# Patient Record
Sex: Female | Born: 1970 | Race: Asian | Hispanic: No | Marital: Married | Smoking: Never smoker
Health system: Southern US, Community
[De-identification: ages and names within clinical notes are randomized; demographics above are authoritative.]

## PROBLEM LIST (undated history)

## (undated) DIAGNOSIS — R0789 Other chest pain: Secondary | ICD-10-CM

## (undated) DIAGNOSIS — D649 Anemia, unspecified: Secondary | ICD-10-CM

## (undated) DIAGNOSIS — R7303 Prediabetes: Secondary | ICD-10-CM

## (undated) DIAGNOSIS — R011 Cardiac murmur, unspecified: Secondary | ICD-10-CM

## (undated) DIAGNOSIS — K219 Gastro-esophageal reflux disease without esophagitis: Secondary | ICD-10-CM

## (undated) HISTORY — PX: WISDOM TOOTH EXTRACTION: SHX21

## (undated) HISTORY — DX: Cardiac murmur, unspecified: R01.1

## (undated) HISTORY — DX: Other chest pain: R07.89

## (undated) HISTORY — DX: Anemia, unspecified: D64.9

## (undated) HISTORY — DX: Gastro-esophageal reflux disease without esophagitis: K21.9

## (undated) HISTORY — DX: Prediabetes: R73.03

---

## 1990-05-15 HISTORY — PX: KNEE SURGERY: SHX244

## 2009-02-15 DIAGNOSIS — R7302 Impaired glucose tolerance (oral): Secondary | ICD-10-CM | POA: Insufficient documentation

## 2014-07-03 ENCOUNTER — Ambulatory Visit (INDEPENDENT_AMBULATORY_CARE_PROVIDER_SITE_OTHER): Payer: 59 | Admitting: Internal Medicine

## 2014-07-03 ENCOUNTER — Other Ambulatory Visit (HOSPITAL_COMMUNITY)
Admission: RE | Admit: 2014-07-03 | Discharge: 2014-07-03 | Disposition: A | Discharge status: Home / Self Care | Payer: 59 | Source: Ambulatory Visit | Attending: Internal Medicine | Admitting: Internal Medicine

## 2014-07-03 ENCOUNTER — Encounter: Payer: Self-pay | Admitting: Internal Medicine

## 2014-07-03 VITALS — BP 112/78 | HR 90 | Temp 99.0°F | Resp 16 | Ht 64.0 in | Wt 124.1 lb

## 2014-07-03 DIAGNOSIS — Z0000 Encounter for general adult medical examination without abnormal findings: Secondary | ICD-10-CM | POA: Diagnosis not present

## 2014-07-03 DIAGNOSIS — Z01419 Encounter for gynecological examination (general) (routine) without abnormal findings: Secondary | ICD-10-CM | POA: Insufficient documentation

## 2014-07-03 DIAGNOSIS — G4700 Insomnia, unspecified: Secondary | ICD-10-CM | POA: Insufficient documentation

## 2014-07-03 MED ORDER — GLUCOSE BLOOD VI STRP: ORAL_STRIP | Status: DC

## 2014-07-03 MED ORDER — ZOLPIDEM TARTRATE 10 MG PO TABS: 10.0000 mg | ORAL_TABLET | Freq: Every evening | ORAL | Status: DC | PRN

## 2014-07-03 MED ORDER — ONETOUCH ULTRASOFT LANCETS MISC: Status: DC

## 2014-07-03 NOTE — Patient Instructions (Signed)
We will check the blood work when you are fasting. Log into mychart to get the results.  We will let you know about the pap smear in about 3-5 day.  We have also put in for the mammogram and you should hear back about scheduling in the next week.  We will see you back for a physical in about 1 year. If you have any problems or questions please feel free to call us sooner.  Health Maintenance Adopting a healthy lifestyle and getting preventive care can go a long way to promote health and wellness. Talk with your health care provider about what schedule of regular examinations is right for you. This is a good chance for you to check in with your provider about disease prevention and staying healthy. In between checkups, there are plenty of things you can do on your own. Experts have done a lot of research about which lifestyle changes and preventive measures are most likely to keep you healthy. Ask your health care provider for more information. WEIGHT AND DIET  Eat a healthy diet  Be sure to include plenty of vegetables, fruits, low-fat dairy products, and lean protein.  Do not eat a lot of foods high in solid fats, added sugars, or salt.  Get regular exercise. This is one of the most important things you can do for your health.  Most adults should exercise for at least 150 minutes each week. The exercise should increase your heart rate and make you sweat (moderate-intensity exercise).  Most adults should also do strengthening exercises at least twice a week. This is in addition to the moderate-intensity exercise.  Maintain a healthy weight  Body mass index (BMI) is a measurement that can be used to identify possible weight problems. It estimates body fat based on height and weight. Your health care provider can help determine your BMI and help you achieve or maintain a healthy weight.  For females 45 years of age and older:   A BMI below 18.5 is considered underweight.  A BMI of 18.5  to 24.9 is normal.  A BMI of 25 to 29.9 is considered overweight.  A BMI of 30 and above is considered obese.  Watch levels of cholesterol and blood lipids  You should start having your blood tested for lipids and cholesterol at 45 years of age, then have this test every 5 years.  You may need to have your cholesterol levels checked more often if:  Your lipid or cholesterol levels are high.  You are older than 45 years of age.  You are at high risk for heart disease.  CANCER SCREENING   Lung Cancer  Lung cancer screening is recommended for adults 79-45 years old who are at high risk for lung cancer because of a history of smoking.  A yearly low-dose CT scan of the lungs is recommended for people who:  Currently smoke.  Have quit within the past 15 years.  Have at least a 30-pack-year history of smoking. A pack year is smoking an average of one pack of cigarettes a day for 1 year.  Yearly screening should continue until it has been 15 years since you quit.  Yearly screening should stop if you develop a health problem that would prevent you from having lung cancer treatment.  Breast Cancer  Practice breast self-awareness. This means understanding how your breasts normally appear and feel.  It also means doing regular breast self-exams. Let your health care provider know about any changes, no  matter how small.  If you are in your 20s or 30s, you should have a clinical breast exam (CBE) by a health care provider every 1-3 years as part of a regular health exam.  If you are 14 or older, have a CBE every year. Also consider having a breast X-ray (mammogram) every year.  If you have a family history of breast cancer, talk to your health care provider about genetic screening.  If you are at high risk for breast cancer, talk to your health care provider about having an MRI and a mammogram every year.  Breast cancer gene (BRCA) assessment is recommended for women who have  family members with BRCA-related cancers. BRCA-related cancers include:  Breast.  Ovarian.  Tubal.  Peritoneal cancers.  Results of the assessment will determine the need for genetic counseling and BRCA1 and BRCA2 testing. Cervical Cancer Routine pelvic examinations to screen for cervical cancer are no longer recommended for nonpregnant women who are considered low risk for cancer of the pelvic organs (ovaries, uterus, and vagina) and who do not have symptoms. A pelvic examination may be necessary if you have symptoms including those associated with pelvic infections. Ask your health care provider if a screening pelvic exam is right for you.   The Pap test is the screening test for cervical cancer for women who are considered at risk.  If you had a hysterectomy for a problem that was not cancer or a condition that could lead to cancer, then you no longer need Pap tests.  If you are older than 65 years, and you have had normal Pap tests for the past 10 years, you no longer need to have Pap tests.  If you have had past treatment for cervical cancer or a condition that could lead to cancer, you need Pap tests and screening for cancer for at least 20 years after your treatment.  If you no longer get a Pap test, assess your risk factors if they change (such as having a new sexual partner). This can affect whether you should start being screened again.  Some women have medical problems that increase their chance of getting cervical cancer. If this is the case for you, your health care provider may recommend more frequent screening and Pap tests.  The human papillomavirus (HPV) test is another test that may be used for cervical cancer screening. The HPV test looks for the virus that can cause cell changes in the cervix. The cells collected during the Pap test can be tested for HPV.  The HPV test can be used to screen women 3 years of age and older. Getting tested for HPV can extend the interval  between normal Pap tests from three to five years.  An HPV test also should be used to screen women of any age who have unclear Pap test results.  After 45 years of age, women should have HPV testing as often as Pap tests.  Colorectal Cancer  This type of cancer can be detected and often prevented.  Routine colorectal cancer screening usually begins at 45 years of age and continues through 45 years of age.  Your health care provider may recommend screening at an earlier age if you have risk factors for colon cancer.  Your health care provider may also recommend using home test kits to check for hidden blood in the stool.  A small camera at the end of a tube can be used to examine your colon directly (sigmoidoscopy or colonoscopy). This is  done to check for the earliest forms of colorectal cancer.  Routine screening usually begins at age 26.  Direct examination of the colon should be repeated every 5-10 years through 45 years of age. However, you may need to be screened more often if early forms of precancerous polyps or small growths are found. Skin Cancer  Check your skin from head to toe regularly.  Tell your health care provider about any new moles or changes in moles, especially if there is a change in a mole's shape or color.  Also tell your health care provider if you have a mole that is larger than the size of a pencil eraser.  Always use sunscreen. Apply sunscreen liberally and repeatedly throughout the day.  Protect yourself by wearing long sleeves, pants, a wide-brimmed hat, and sunglasses whenever you are outside. HEART DISEASE, DIABETES, AND HIGH BLOOD PRESSURE   Have your blood pressure checked at least every 1-2 years. High blood pressure causes heart disease and increases the risk of stroke.  If you are between 48 years and 58 years old, ask your health care provider if you should take aspirin to prevent strokes.  Have regular diabetes screenings. This involves  taking a blood sample to check your fasting blood sugar level.  If you are at a normal weight and have a low risk for diabetes, have this test once every three years after 45 years of age.  If you are overweight and have a high risk for diabetes, consider being tested at a younger age or more often. PREVENTING INFECTION  Hepatitis B  If you have a higher risk for hepatitis B, you should be screened for this virus. You are considered at high risk for hepatitis B if:  You were born in a country where hepatitis B is common. Ask your health care provider which countries are considered high risk.  Your parents were born in a high-risk country, and you have not been immunized against hepatitis B (hepatitis B vaccine).  You have HIV or AIDS.  You use needles to inject street drugs.  You live with someone who has hepatitis B.  You have had sex with someone who has hepatitis B.  You get hemodialysis treatment.  You take certain medicines for conditions, including cancer, organ transplantation, and autoimmune conditions. Hepatitis C  Blood testing is recommended for:  Everyone born from [birth year] through 1966.  Anyone with known risk factors for hepatitis C. Sexually transmitted infections (STIs)  You should be screened for sexually transmitted infections (STIs) including gonorrhea and chlamydia if:  You are sexually active and are younger than 45 years of age.  You are older than 45 years of age and your health care provider tells you that you are at risk for this type of infection.  Your sexual activity has changed since you were last screened and you are at an increased risk for chlamydia or gonorrhea. Ask your health care provider if you are at risk.  If you do not have HIV, but are at risk, it may be recommended that you take a prescription medicine daily to prevent HIV infection. This is called pre-exposure prophylaxis (PrEP). You are considered at risk if:  You are sexually active  and do not regularly use condoms or know the HIV status of your partner(s).  You take drugs by injection.  You are sexually active with a partner who has HIV. Talk with your health care provider about whether you are at high risk of being infected  with HIV. If you choose to begin PrEP, you should first be tested for HIV. You should then be tested every 3 months for as long as you are taking PrEP.  PREGNANCY   If you are premenopausal and you may become pregnant, ask your health care provider about preconception counseling.  If you may become pregnant, take 400 to 800 micrograms (mcg) of folic acid every day.  If you want to prevent pregnancy, talk to your health care provider about birth control (contraception). OSTEOPOROSIS AND MENOPAUSE   Osteoporosis is a disease in which the bones lose minerals and strength with aging. This can result in serious bone fractures. Your risk for osteoporosis can be identified using a bone density scan.  If you are 30 years of age or older, or if you are at risk for osteoporosis and fractures, ask your health care provider if you should be screened.  Ask your health care provider whether you should take a calcium or vitamin D supplement to lower your risk for osteoporosis.  Menopause may have certain physical symptoms and risks.  Hormone replacement therapy may reduce some of these symptoms and risks. Talk to your health care provider about whether hormone replacement therapy is right for you.  HOME CARE INSTRUCTIONS   Schedule regular health, dental, and eye exams.  Stay current with your immunizations.   Do not use any tobacco products including cigarettes, chewing tobacco, or electronic cigarettes.  If you are pregnant, do not drink alcohol.  If you are breastfeeding, limit how much and how often you drink alcohol.  Limit alcohol intake to no more than 1 drink per day for nonpregnant women. One drink equals 12 ounces of beer, 5 ounces of wine,  or 1 ounces of hard liquor.  Do not use street drugs.  Do not share needles.  Ask your health care provider for help if you need support or information about quitting drugs.  Tell your health care provider if you often feel depressed.  Tell your health care provider if you have ever been abused or do not feel safe at home. Document Released: 11/14/2010 Document Revised: 09/15/2013 Document Reviewed: 04/02/2013 [facility] Patient Information 2015 [place], [place]. This information is not intended to replace advice given to you by your health care provider. Make sure you discuss any questions you have with your health care provider.

## 2014-07-03 NOTE — Assessment & Plan Note (Signed)
Mild and uses ambien infrequently with traveling etc. Refilled today.

## 2014-07-03 NOTE — Progress Notes (Signed)
Pre visit review using our clinic review tool, if applicable. No additional management support is needed unless otherwise documented below in the visit note. 

## 2014-07-03 NOTE — Progress Notes (Signed)
   Subjective:    Patient ID: Shirley Tucker, female    DOB: 01-29-1971, 44 y.o.   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 She denies new complaints. She does have occasional insomnia from work and from traveling for which she uses [place] infrequently. She did have some borderline diabetes in the past but she has modified her diet to keep from having that as a problem. She is married and has 2 children (9 and 59)  PMH, [place], social history, allergies, medications, problem list reviewed and updated.   Review of Systems  Constitutional: Negative for chills, activity change, appetite change, fatigue and unexpected weight change.  HENT: Negative.   Eyes: Negative.   Respiratory: Negative for cough, chest tightness, shortness of breath and wheezing.   Cardiovascular: Negative for chest pain, palpitations and leg swelling.  Gastrointestinal: Negative for nausea, abdominal pain, diarrhea, constipation and abdominal distention.  Endocrine: Negative.   Musculoskeletal: Negative.   Skin: Negative.   Neurological: Negative.   Psychiatric/Behavioral: Negative.       Objective:   Physical Exam  Constitutional: She is oriented to person, place, and time. She appears well-developed and well-nourished.  HENT:  Head: Normocephalic and atraumatic.  Eyes: EOM are normal.  Neck: Normal range of motion.  Cardiovascular: Normal rate and regular rhythm.   No murmur heard. Carotids without bruits  Pulmonary/Chest: Effort normal and breath sounds normal. No respiratory distress. She has no wheezes. She has no rales.  Abdominal: Soft. Bowel sounds are normal. She exhibits no distension. There is no tenderness.  Genitourinary: Vagina normal and uterus normal. No vaginal discharge found.  Pap smear done  Musculoskeletal: She exhibits no edema.  Neurological: She is alert and oriented to person, place, and time. Coordination normal.  Skin: Skin is warm and  dry.  Psychiatric: She has a normal mood and affect. Her behavior is normal.   Filed Vitals:   07/03/14 1518  BP: 112/78  Pulse: 90  Temp: 99 F (37.2 C)  Resp: 16  Height: 5\' 4"  (1.626 m)  Weight: 124 lb 1.9 oz (56.3 kg)  SpO2: 96%      Assessment & Plan:

## 2014-07-03 NOTE — Assessment & Plan Note (Signed)
Pap smear done today (no abnormal in history), mammogram ordered (no abnormal in past). Tdap and flu up to date. Exercises when able and eating reasonably healthy. Non-smoker. Check labs today.

## 2014-07-07 LAB — CYTOLOGY - PAP

## 2014-07-21 ENCOUNTER — Other Ambulatory Visit: Payer: Self-pay | Admitting: Internal Medicine

## 2014-07-21 DIAGNOSIS — Z1231 Encounter for screening mammogram for malignant neoplasm of breast: Secondary | ICD-10-CM

## 2014-08-05 ENCOUNTER — Ambulatory Visit: Payer: Self-pay

## 2014-08-19 ENCOUNTER — Ambulatory Visit: Payer: Self-pay

## 2014-08-30 LAB — HM MAMMOGRAPHY

## 2014-09-09 ENCOUNTER — Ambulatory Visit
Admission: RE | Admit: 2014-09-09 | Discharge: 2014-09-09 | Disposition: A | Discharge status: Home / Self Care | Payer: 59 | Source: Ambulatory Visit | Attending: Internal Medicine | Admitting: Internal Medicine

## 2014-09-09 DIAGNOSIS — Z1231 Encounter for screening mammogram for malignant neoplasm of breast: Secondary | ICD-10-CM

## 2014-11-02 ENCOUNTER — Telehealth: Payer: Self-pay | Admitting: Internal Medicine

## 2014-11-02 NOTE — Telephone Encounter (Signed)
Pt called in and wanted the results of her blood work.  She said that she had her blood work done at the [employer] because she works there.    Best number [phone]

## 2014-11-02 NOTE — Telephone Encounter (Signed)
There are not any recent labs in our system.

## 2014-12-09 ENCOUNTER — Other Ambulatory Visit: Payer: Self-pay | Admitting: Hematology

## 2015-01-12 ENCOUNTER — Other Ambulatory Visit: Payer: Self-pay | Admitting: Internal Medicine

## 2015-01-15 ENCOUNTER — Other Ambulatory Visit: Payer: Self-pay | Admitting: Internal Medicine

## 2015-01-15 MED ORDER — ZOLPIDEM TARTRATE 10 MG PO TABS: ORAL_TABLET | ORAL | Status: DC

## 2015-01-15 MED ORDER — GLUCOSE BLOOD VI STRP: ORAL_STRIP | Status: DC

## 2015-01-15 MED ORDER — ONETOUCH ULTRASOFT LANCETS MISC: Status: DC

## 2015-02-01 ENCOUNTER — Other Ambulatory Visit: Payer: Self-pay | Admitting: Internal Medicine

## 2015-02-01 MED ORDER — GLUCOSE BLOOD VI STRP: ORAL_STRIP | Status: DC

## 2015-02-01 MED ORDER — ONETOUCH ULTRASOFT LANCETS MISC: Status: AC

## 2015-02-15 ENCOUNTER — Other Ambulatory Visit: Payer: Self-pay

## 2015-02-15 MED ORDER — GLUCOSE BLOOD VI STRP: ORAL_STRIP | Status: DC

## 2015-02-24 ENCOUNTER — Other Ambulatory Visit (INDEPENDENT_AMBULATORY_CARE_PROVIDER_SITE_OTHER): Payer: 59

## 2015-02-24 DIAGNOSIS — Z0000 Encounter for general adult medical examination without abnormal findings: Secondary | ICD-10-CM

## 2015-02-24 LAB — COMPREHENSIVE METABOLIC PANEL
ALBUMIN: 4.5 g/dL (ref 3.5–5.2)
ALT: 13 U/L (ref 0–35)
AST: 17 U/L (ref 0–37)
Alkaline Phosphatase: 39 U/L (ref 39–117)
BILIRUBIN TOTAL: 0.4 mg/dL (ref 0.2–1.2)
BUN: 16 mg/dL (ref 6–23)
CALCIUM: 9.5 mg/dL (ref 8.4–10.5)
CHLORIDE: 104 meq/L (ref 96–112)
CO2: 25 mEq/L (ref 19–32)
Creatinine, Ser: 0.77 mg/dL (ref 0.40–1.20)
GFR: 85.92 mL/min (ref >60.00)
Glucose, Bld: 106 mg/dL — ABNORMAL HIGH (ref 70–99)
Potassium: 3.9 mEq/L (ref 3.5–5.1)
SODIUM: 138 meq/L (ref 135–145)
TOTAL PROTEIN: 7.9 g/dL (ref 6.0–8.3)

## 2015-02-24 LAB — LIPID PANEL
Cholesterol: 160 mg/dL (ref 0–200)
HDL: 63.7 mg/dL (ref >39.00)
LDL Cholesterol: 87 mg/dL (ref 0–99)
NonHDL: 96.17
Total CHOL/HDL Ratio: 3
Triglycerides: 45 mg/dL (ref 0.0–149.0)
VLDL: 9 mg/dL (ref 0.0–40.0)

## 2015-02-24 LAB — HEMOGLOBIN A1C: HEMOGLOBIN A1C: 5.9 % (ref 4.6–6.5)

## 2015-03-09 ENCOUNTER — Other Ambulatory Visit: Payer: Self-pay | Admitting: *Deleted

## 2015-03-09 VITALS — BP 102/68 | Ht 64.0 in | Wt 122.0 lb

## 2015-03-10 ENCOUNTER — Encounter: Payer: Self-pay | Admitting: *Deleted

## 2015-03-10 NOTE — Patient Outreach (Signed)
[facility]) Care Management   03/09/2015  Feather Berrie April 12, 1971 [id number]  Artelia Game is an 44 y.o. female who presents to the [place] Management office to enroll in the Link To Wellness program for self management assistance with prediabetes.  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 She says she has had glucose intolerance since the birth of her first child 11 years ago. She says her husband has an appointment to enroll in the program as he too has prediabetes in addition to HTN.  Objective:   Review of Systems  Constitutional: Negative.     Physical Exam  Constitutional: She is oriented to person, place, and time. She appears well-developed and well-nourished.  Neurological: She is alert and oriented to person, place, and time.  Skin: Skin is warm and dry.  Psychiatric: She has a normal mood and affect. Her behavior is normal. Judgment and thought content normal.   Filed Vitals:   03/09/15 1600  BP: 102/68   Filed Weights   03/09/15 1600  Weight: 122 lb (55.339 kg)    Current Medications:   Current Outpatient Prescriptions  Medication Sig Dispense Refill  . zolpidem (AMBIEN) 10 MG tablet TAKE ONE-HALF TO ONE TABLET BY MOUTH AT BEDTIME AS NEEDED FOR SLEEP 30 tablet 1   No current facility-administered medications for this visit.    Functional Status:   In your present state of health, do you have any difficulty performing the following activities: 03/09/2015  Hearing? N  Vision? N  Difficulty concentrating or making decisions? N  Walking or climbing stairs? N  Dressing or bathing? N  Doing errands, shopping? N    Fall/Depression Screening:    PHQ 2/9 Scores 03/09/2015  PHQ - 2 Score 0    Assessment:  [employer] employee  enrolling in the Link To Wellness program for self management assistance with prediabetes. (A1C= 5.9% on 02/24/15)  Plan:  [facility] CM Care Plan Problem One        Most Recent Value   Care Plan Problem One  Prediabetes with current A1C= 5.9%   Role Documenting the Problem One  Care Management [place] for Problem One  Active   THN Long Term Goal (31-90 days)  No progression to Type II DM as evidenced by A1C consistently < 6.5%   THN Long Term Goal Start Date  03/09/15   Interventions for Problem One Long Term Goal  Discussed Link to Wellness program goals, requirements and benefits, reviewed member's rights and responsibilities ,provided diabetes information packet with explanation of contents, ensured member agreed and signed consent to participate and authorization to release and receive health information, consent, participation agreement and consent to enroll in program, assessed member's current knowledge of prediabetes, [facility]ed [facility] for enrollment in to the required program education, discussed nutritional counseling benefit provided by [place] and encouraged patient to see dietician to assist with dietary management of prediabetes if she prefers, discussed effects of physical activity on insulin by improving insulin sensitivity and assisting with weight management and cardiovascular health, discussed exercise opportunities offered for free by [facility], encouraged patient to continue to exercise with goal of 150 minutes per week with 2 resistance sessions per week,  issued member a True Metrix glucometer and starter kit and instructed her on how to obtain  member how to obtain testing supplies at no cost at a [facility] pharmacy, discussed blood glucose monitoring and interpretation, discussed normal ranges for pre-meal and post-meal, provided blood sugar log sheets, encouraged patient to participate in the [facility] [facility]   Healthy Rewards Program to assist with healthy lifestyle changes  and to be eligible for cash rewards,  arranged for Link To Wellness follow up  in Feb 2017     RNCM to fax today's office visit note to Dr. Sharlet Salina. RNCM will meet quarterly and as needed with patient per Link To Wellness program guidelines to assist with prediabetes self-management and assess patient's progress toward mutually set goals.  Barrington Ellison RN,CCM,CDE [place] Management Coordinator Link To Wellness Office Phone [phone] Office Fax [phone]

## 2015-03-11 NOTE — Addendum Note (Signed)
Addended by: Barrington Ellison on: 03/11/2015 11:28 AM   Modules accepted: Orders

## 2015-04-21 ENCOUNTER — Ambulatory Visit: Payer: 59 | Admitting: Skilled Nursing Facility1

## 2015-05-26 ENCOUNTER — Ambulatory Visit: Payer: 59 | Admitting: Skilled Nursing Facility1

## 2015-06-15 ENCOUNTER — Telehealth: Payer: Self-pay | Admitting: Internal Medicine

## 2015-06-15 NOTE — Telephone Encounter (Signed)
lmovm regarding an email pt sent to Dr. Sharlet Salina. She wanted a physical on a Weds afternoon, but Dr. Sharlet Salina is not here on Weds afternoon.  Asked her to call the office to schedule at another time.

## 2015-07-07 ENCOUNTER — Other Ambulatory Visit: Payer: Self-pay | Admitting: *Deleted

## 2015-07-07 VITALS — BP 90/60 | Wt 124.2 lb

## 2015-07-07 DIAGNOSIS — R7309 Other abnormal glucose: Secondary | ICD-10-CM

## 2015-07-07 LAB — POCT GLYCOSYLATED HEMOGLOBIN (HGB A1C): HEMOGLOBIN A1C: 6.1

## 2015-07-08 NOTE — Patient Outreach (Signed)
[facility]) Care Management   07/07/15  Shirley Tucker Dec 29, 1970 [id number]  Shirley Tucker is an 45 y.o. female who presents to the [place] Management office for routine Link To Wellness follow up for self management assistance with prediabetes.  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 She likes to bike around her neighborhood and swim but has not done so lately. She says she will traveling to [place] next week because her Mom has been hospitalized so she will have to change her appointment with [facility] RD [facility].  Objective:   Review of Systems  Constitutional: Negative.     Physical Exam  Constitutional: She appears well-developed and well-nourished.  Respiratory: Effort normal.  Skin: Skin is warm and dry.  Psychiatric: She has a normal mood and affect. Her behavior is normal. Judgment and thought content normal.   Filed Vitals:   07/07/15 1607  BP: 90/60   Filed Weights   07/07/15 1607  Weight: 124 lb 3.2 oz (56.337 kg)  POC Hgb A1C= 6.1%  Current Medications:   Current Outpatient Prescriptions  Medication Sig Dispense Refill  . glucose blood test strip Use as instructed to check sugars daily. DX E11.09 (Patient not taking: Reported on 03/10/2015) 100 each 12  . Lancets (ONETOUCH ULTRASOFT) lancets Use as instructed to check sugars daily (Patient not taking: Reported on 03/10/2015) 100 each 12  . zolpidem (AMBIEN) 10 MG tablet TAKE ONE-HALF TO ONE TABLET BY MOUTH AT BEDTIME AS NEEDED FOR SLEEP 30 tablet 1   No current facility-administered medications for this visit.    Functional Status:   In your present state of health, do you have any difficulty performing the following activities: 03/09/2015  Hearing? N  Vision? N  Difficulty concentrating or making decisions? N  Walking or climbing stairs? N  Dressing or bathing? N  Doing  errands, shopping? N    Fall/Depression Screening:    PHQ 2/9 Scores 03/09/2015  PHQ - 2 Score 0    Assessment:   [employer] employee and Link To Wellness member with prediabetes with slightly increased Hgb A1C from 5.9% to 6.1% in last four months.  Plan:  [facility] CM Care Plan Problem One        Most Recent Value   Care Plan Problem One  Prediabetes with slightly increased Hgb A1C from 5.9% to 6.1% in last 4 months    Role Documenting the Problem One  Care Management Coordinator   Care Plan for Problem One  Active   THN Long Term Goal (31-90 days)  No progression to Type II DM as evidenced by Hgb A1C consistently < 6.5%   THN Long Term Goal Start Date  07/07/2015   [facility] Long Term Goal Met Date     Interventions for Problem One Long Term Goal  reviewed prediabetes management strategies including meal planning, blood glucose self monitoring, and exercise, provided Dr. Burr Medico with test strips until she can see her primary care MD for a prescription for testing supplies,provided strips to Dr Burr Medico at no cost until she can see her primary care MD       RNCM to fax today's office visit note to Dr. Sharlet Salina. RNCM will meet quarterly and as needed with patient per Link To Wellness program guidelines to assist with prediabetes self-management and assess patient's progress toward mutually set goals. Barrington Ellison RN,CCM,CDE  [employer]/gallery exhibitions officer To [employer] [phone] Office Fax [phone]

## 2015-07-14 ENCOUNTER — Ambulatory Visit: Payer: 59 | Admitting: Skilled Nursing Facility1

## 2015-07-21 ENCOUNTER — Encounter: Payer: 59 | Attending: Internal Medicine | Admitting: Skilled Nursing Facility1

## 2015-07-21 ENCOUNTER — Encounter: Payer: Self-pay | Admitting: Skilled Nursing Facility1

## 2015-07-21 VITALS — Ht 64.0 in | Wt 123.0 lb

## 2015-07-21 DIAGNOSIS — Z713 Dietary counseling and surveillance: Secondary | ICD-10-CM | POA: Diagnosis not present

## 2015-07-21 DIAGNOSIS — R7303 Prediabetes: Secondary | ICD-10-CM

## 2015-07-21 NOTE — Progress Notes (Signed)
  Medical Nutrition Therapy:  Appt start time: 1400 end time:  1500.   Assessment:  Primary concerns today: referred for prediabetes. Pt states 12 years ago in her first trimester her blood glucose was high. Pt states when she checks her postprandial glucose her Number is between 70/80-180. A1C 5.9.    Preferred Learning Style:   No preference indicated   Learning Readiness:   Ready  MEDICATIONS: See List   DIETARY INTAKE:  Usual eating pattern includes 3 meals and 2 snacks per day.  Everyday foods include none stated.  Avoided foods include none stated.    24-hr recall:  B ( AM): oatmeal-----white rice----muffin and egg---bagel Snk ( AM): none L ( PM): vegetable, protein, rice or bread Snk ( PM): nuts and chocolate----cookie D ( PM): brown/white rice, protein, vegetbles Snk ( PM): fruit Beverages: milk, green tea, coffee, water  Usual physical activity: biking, hiking, swimming (all non-consistant)  Estimated energy needs: 1800 calories 200 g carbohydrates 135 g protein 50 g fat  Progress Towards Goal(s):  In progress.   Nutritional Diagnosis:  NB-1.1 Food and nutrition-related knowledge deficit As related to no prior nutrition education from a nutrition professional.  As evidenced by newly diagnosed prediabetes.    Intervention:  Nutrition counseling for prediabetes. Dietitian educated the pt on meal balance, eating the throughout the day, carbohydrate containing foods, and the importance of physical activity. Goals: Aim for about 30-45 grams of carbohydrate per meal and 15 grams for snacks Eat carbohydrates as a part of a meal or a snack Try to limit your rice to one cup cooked  Teaching Method Utilized:  Visual Auditory Hands on  Handouts given during visit include:  Snack sheet  Barriers to learning/adherence to lifestyle change: none identified  Demonstrated degree of understanding via:  Teach Back   Monitoring/Evaluation:  Dietary intake,  exercise, A1C, and body weight prn.

## 2015-08-18 ENCOUNTER — Ambulatory Visit: Payer: 59 | Admitting: Dietician

## 2015-08-30 ENCOUNTER — Encounter: Payer: Self-pay | Admitting: Internal Medicine

## 2015-08-30 ENCOUNTER — Ambulatory Visit (INDEPENDENT_AMBULATORY_CARE_PROVIDER_SITE_OTHER): Payer: 59 | Admitting: Internal Medicine

## 2015-08-30 VITALS — BP 120/82 | HR 80 | Temp 99.0°F | Resp 14 | Ht 64.0 in | Wt 127.0 lb

## 2015-08-30 DIAGNOSIS — Z0000 Encounter for general adult medical examination without abnormal findings: Secondary | ICD-10-CM

## 2015-08-30 DIAGNOSIS — Z1239 Encounter for other screening for malignant neoplasm of breast: Secondary | ICD-10-CM

## 2015-08-30 MED ORDER — LANCET DEVICE MISC: Status: AC

## 2015-08-30 MED ORDER — GLUCOSE BLOOD VI STRP: ORAL_STRIP | Status: DC

## 2015-08-30 NOTE — Patient Instructions (Signed)
We have sent in the strips and lancets.

## 2015-08-30 NOTE — Progress Notes (Signed)
Pre visit review using our clinic review tool, if applicable. No additional management support is needed unless otherwise documented below in the visit note. 

## 2015-08-30 NOTE — Progress Notes (Signed)
   Subjective:    Patient ID: Shirley Tucker, female    DOB: [birth year], 45 y.o.   MRN: [id number]  HPI The patient is a 45 YO female coming in for wellness. No new concerns or complaints. Working with wellness program on her sugars. Last HgA1c 6.1. Not exercising like she should.   PMH, [facility], social history reviewed and updated.   Review of Systems  Constitutional: Negative for chills, activity change, appetite change, fatigue and unexpected weight change.  HENT: Negative.   Eyes: Negative.   Respiratory: Negative for cough, chest tightness, shortness of breath and wheezing.   Cardiovascular: Negative for chest pain, palpitations and leg swelling.  Gastrointestinal: Negative for nausea, abdominal pain, diarrhea, constipation and abdominal distention.  Musculoskeletal: Negative.   Skin: Negative.   Neurological: Negative.   Psychiatric/Behavioral: Negative.       Objective:   Physical Exam  Constitutional: She is oriented to person, place, and time. She appears well-developed and well-nourished.  HENT:  Head: Normocephalic and atraumatic.  Eyes: EOM are normal.  Neck: Normal range of motion.  Cardiovascular: Normal rate and regular rhythm.   No murmur heard. Pulmonary/Chest: Effort normal and breath sounds normal. No respiratory distress. She has no wheezes. She has no rales.  Abdominal: Soft. Bowel sounds are normal. She exhibits no distension. There is no tenderness.  Musculoskeletal: She exhibits no edema.  Neurological: She is alert and oriented to person, place, and time. Coordination normal.  Skin: Skin is warm and dry.  Psychiatric: She has a normal mood and affect. Her behavior is normal.   Filed Vitals:   08/30/15 1607  BP: 120/82  Pulse: 80  Temp: 99 F (37.2 C)  TempSrc: Oral  Resp: 14  Height: 5\' 4"  (1.626 m)  Weight: 127 lb (57.607 kg)  SpO2: 99%      Assessment & Plan:

## 2015-08-31 ENCOUNTER — Encounter: Payer: Self-pay | Admitting: Internal Medicine

## 2015-08-31 ENCOUNTER — Other Ambulatory Visit: Payer: Self-pay | Admitting: Internal Medicine

## 2015-08-31 DIAGNOSIS — Z1231 Encounter for screening mammogram for malignant neoplasm of breast: Secondary | ICD-10-CM

## 2015-08-31 NOTE — Assessment & Plan Note (Signed)
Labs OK, will follow her HgA1c with the wellness program and last 6.1. Not exercising and advised her to start. Non-smoker. Ordered mammogram. Colonoscopy at 39. Talked to her about screening recommendations. Pap smear up to date.

## 2015-09-15 ENCOUNTER — Ambulatory Visit
Admission: RE | Admit: 2015-09-15 | Discharge: 2015-09-15 | Disposition: A | Discharge status: Home / Self Care | Payer: 59 | Source: Ambulatory Visit | Attending: Internal Medicine | Admitting: Internal Medicine

## 2015-09-15 DIAGNOSIS — Z1231 Encounter for screening mammogram for malignant neoplasm of breast: Secondary | ICD-10-CM

## 2015-11-17 ENCOUNTER — Ambulatory Visit: Payer: Self-pay | Admitting: *Deleted

## 2015-12-07 ENCOUNTER — Other Ambulatory Visit: Payer: Self-pay | Admitting: Internal Medicine

## 2015-12-08 NOTE — Telephone Encounter (Signed)
Faxed to pharmacy

## 2015-12-10 ENCOUNTER — Other Ambulatory Visit: Payer: Self-pay | Admitting: Internal Medicine

## 2015-12-10 ENCOUNTER — Other Ambulatory Visit: Payer: Self-pay | Admitting: Hematology

## 2015-12-13 NOTE — Telephone Encounter (Signed)
Please advise, thanks.

## 2015-12-13 NOTE — Telephone Encounter (Signed)
This was filled on 12/08/15 so please call to see if they received. If not call that rx in.

## 2015-12-13 NOTE — Telephone Encounter (Signed)
I called [facility], they did not receive on 7/26, I have recalled rx into pharm

## 2016-01-12 ENCOUNTER — Other Ambulatory Visit: Payer: Self-pay | Admitting: *Deleted

## 2016-01-12 ENCOUNTER — Encounter: Payer: Self-pay | Admitting: *Deleted

## 2016-01-12 VITALS — BP 104/70 | Ht 64.0 in | Wt 127.6 lb

## 2016-01-12 DIAGNOSIS — R7309 Other abnormal glucose: Secondary | ICD-10-CM

## 2016-01-12 LAB — POCT GLYCOSYLATED HEMOGLOBIN (HGB A1C): HEMOGLOBIN A1C: 5.9

## 2016-01-12 LAB — POCT CBG (FASTING - GLUCOSE)-MANUAL ENTRY: Glucose Fasting, POC: 90 mg/dL (ref 70–99)

## 2016-01-12 NOTE — Patient Outreach (Signed)
[place] [facility]) Care Management   01/12/2016  Shirley Tucker Sep 29, 1970 [id number]  Shirley Tucker is an 45 y.o. female who presents to the [place] Management office for routine Link To Wellness follow up for self management assistance with prediabetes.  Subjective:  Dr. Burr Medico says she is checking her blood sugar at least weekly and usually after meals. She says she occasionally checks a fasting and reports her fasting vary from 100-120 with a rare high of 150. Her post meal checks usually meet target of <130. She says her goal is to keep her Hgb A1C <6.0%. She reports she continues to bike, swim and walk but is not consistent with her exercise.  She says she had her annual wellness exam with Dr. Sharlet Salina on 08/30/15 and was told no labs were necessary at that visit. She will see her again in April 2018. She says she is up to date on her health maintenance checks. She says she visited her elderly mother who has dementia and her elderly father who live in [place] in June and her  Mom is doing "about the same." Her father has outside help that provides personal care for her Mom.  She says her two nieces continue to live with her and her husband in addition to her own children and afternoons are very busy because each child goes to a different school.   Objective:   Review of Systems  Constitutional: Negative.     Physical Exam  Constitutional: She is oriented to person, place, and time. She appears well-developed and well-nourished.  Respiratory: Effort normal.  Neurological: She is alert and oriented to person, place, and time.  Skin: Skin is warm and dry.  Psychiatric: She has a normal mood and affect. Her behavior is normal. Judgment and thought content normal.   Filed Weights   01/12/16 1607  Weight: 127 lb 9.6 oz (57.9 kg)   Vitals:   01/12/16 1607  BP: 104/70   POC random CBG= 90 POC Hgb A1C= 5.9%  Encounter Medications:   Outpatient  Encounter Prescriptions as of 01/12/2016  Medication Sig Note  . glucose blood (TRUE METRIX BLOOD GLUCOSE TEST) test strip Use as instructed check sugars daily   . Lancet Device MISC Needs true metrix   . Lancets (ONETOUCH ULTRASOFT) lancets Use as instructed to check sugars daily   . zolpidem (AMBIEN) 10 MG tablet TAKE 1/2 TO 1 TABLET BY MOUTH AT BEDTIME AS NEEDED FOR SLEEP 01/12/2016: Takes as needed   No facility-administered encounter medications on file as of 01/12/2016.     Functional Status:   In your present state of health, do you have any difficulty performing the following activities: 01/12/2016 03/09/2015  Hearing? N N  Vision? N N  Difficulty concentrating or making decisions? N N  Walking or climbing stairs? N N  Dressing or bathing? N N  Doing errands, shopping? N N  Some recent data might be hidden    Fall/Depression Screening:    PHQ 2/9 Scores 01/12/2016 07/21/2015 03/09/2015  PHQ - 2 Score 0 0 0    Assessment:  [place] oncologist with prediabetes with today's point of care Hgb A1C = 5.9% meeting her personal goal of <6.0%.   Plan:  [facility] CM Care Plan Problem One   Flowsheet Row Most Recent Value  Care Plan Problem One Prediabetes with improved  Hgb A1C from 6.1% to 5.9% meeting Dr. Ernestina Penna personal goals of <6.0%.  Role Documenting the Problem One  Care Management Coordinator  Care Plan for Problem One  Active  [facility] Long Term Goal (31-90 days) Ongoing good control of prediabetes as evidenced by Hgb A1C <6.0%  THN Long Term Goal Start Date  01/12/16  THN Long Term Goal Met Date    Interventions for Problem One Long Term Goal reviewed prediabetes management strategies including meal planning, blood glucose self monitoring, and exercise, reviewed outcome of visit with registered dietician on 07/21/15, reviewed lipid profile and Hgb A1C results of 02/24/15, discussed changes to the Link To Wellness program in 2018 and Dr. Burr Medico said hopes she can continue to have point of  care  Hgb A1C checked at her Link To Wellness office visits between her annual wellness visits, arranged for Link To Wellness follow up in December 2017     RNCM to fax today's office visit note to Dr. Pricilla Holm. RNCM will meet twice yearly and as needed with patient to assist with prediabetes self-management and assess patient's progress toward mutually set goals.  Barrington Ellison RN,CCM,CDE [place] Management Coordinator Link To Wellness Office Phone [phone] Office Fax [phone]

## 2016-04-19 ENCOUNTER — Ambulatory Visit: Payer: Self-pay | Admitting: *Deleted

## 2016-04-24 ENCOUNTER — Encounter: Payer: Self-pay | Admitting: *Deleted

## 2016-04-24 ENCOUNTER — Other Ambulatory Visit: Payer: Self-pay | Admitting: *Deleted

## 2016-04-24 VITALS — BP 106/70 | Ht 64.0 in | Wt 128.2 lb

## 2016-04-24 DIAGNOSIS — R7309 Other abnormal glucose: Secondary | ICD-10-CM

## 2016-04-24 LAB — POCT GLYCOSYLATED HEMOGLOBIN (HGB A1C): Hemoglobin A1C: 6

## 2016-04-24 NOTE — Patient Outreach (Addendum)
[facility]) Care Management   04/24/2016   Shirley Tucker Apr 06, 1971 [id number]  Shirley Tucker is an 45 y.o. female who presents to the [facility] Management office for routine Link To Wellness follow up for self management assistance with prediabetes.  Subjective:  Dr. Burr Medico says she is checking her blood sugar at least weekly and usually after meals. She says she occasionally checks a fasting and reports her fasting vary from 100-120 with a rare high of 150. Her post meal checks usually meet target of <130. She says her goal is to keep her Hgb A1C <6.0%. She reports she continues to bike, swim and walk but is not consistent with her exercise. She says her husband recently purchased a home treadmill and home stationary bike so she has been utilizing them.  She says she had her annual wellness exam with Dr. Sharlet Salina on 08/30/15 and was told no labs were necessary at that visit. She will see her again in April 2018. She says she is up to date on her health maintenance checks. She says she will visit her elderly mother who has dementia and her elderly father who live in [place] in February for the [place].  She says her  Mom is doing "about the same." Her father has outside help that provides personal care for her Mom.  She says her two nieces continue to live with her and her husband in addition to her own children and afternoons are very busy because each child goes to a different school.   Objective:   Review of Systems  Constitutional: Negative.     Physical Exam  Constitutional: She is oriented to person, place, and time. She appears well-developed and well-nourished.  Respiratory: Effort normal.  Neurological: She is alert and oriented to person, place, and time.  Skin: Skin is warm and dry.  Psychiatric: She has a normal mood and affect. Her behavior is normal. Judgment and thought content normal.   Filed Weights   04/24/16 1507  Weight:  128 lb 3.2 oz (58.2 kg)   Vitals:   04/24/16 1507  BP: 106/70   POC random CBG= 148 POC Hgb A1C= 6.0%  Encounter Medications:   Outpatient Encounter Prescriptions as of 04/24/2016  Medication Sig Note  . glucose blood (TRUE METRIX BLOOD GLUCOSE TEST) test strip Use as instructed check sugars daily   . Lancet Device MISC Needs true metrix   . Lancets (ONETOUCH ULTRASOFT) lancets Use as instructed to check sugars daily   . zolpidem (AMBIEN) 10 MG tablet TAKE 1/2 TO 1 TABLET BY MOUTH AT BEDTIME AS NEEDED FOR SLEEP 01/12/2016: Takes as needed   No facility-administered encounter medications on file as of 04/24/2016.     Functional Status:   In your present state of health, do you have any difficulty performing the following activities: 01/12/2016  Hearing? N  Vision? N  Difficulty concentrating or making decisions? N  Walking or climbing stairs? N  Dressing or bathing? N  Doing errands, shopping? N  Some recent data might be hidden    Fall/Depression Screening:    PHQ 2/9 Scores 01/12/2016 07/21/2015 03/09/2015  PHQ - 2 Score 0 0 0    Assessment:  [place] oncologist with prediabetes with today's point of care Hgb A1C = 6.0%, not meeting her personal goal of <6.0%.   Plan:  [facility] CM Care Plan Problem One   Flowsheet Row Most Recent Value  Care Plan Problem One Prediabetes  with today's point of care Hgb A1C = 6.0%, not meeting Dr. Ernestina Penna personal goals of <6.0%.  Role Documenting the Problem One  Care Management [place] for Problem One  Active  [facility] Long Term Goal (31-90 days) Ongoing good control of prediabetes as evidenced by Hgb A1C <6.0%, she will actively participate in the [facility] digital assistant beginning in 2018  [place] Term Goal Start Date  04/24/16  [facility] Long Term Goal Met Date    Interventions for Problem One Long Term Goal reviewed prediabetes management strategies including meal planning, blood glucose self monitoring, and exercise, reviewed  changes to the Link To Wellness program in 2018 including transition to the [employer], enrolled Dr. Burr Medico in [place] and provided brochure to take to her husband so he may enroll using her employee number     RNCM to fax today's office visit note to Dr. Pricilla Holm.  Barrington Ellison RN,CCM,CDE [place] Management Coordinator Link To Wellness Office Phone [phone] Office Fax [phone]

## 2016-04-24 NOTE — Addendum Note (Signed)
Addended by: Barrington Ellison on: 04/24/2016 03:38 PM   Modules accepted: Orders

## 2016-04-26 ENCOUNTER — Ambulatory Visit: Payer: 59 | Admitting: *Deleted

## 2016-08-23 ENCOUNTER — Telehealth: Payer: Self-pay | Admitting: Internal Medicine

## 2016-08-23 ENCOUNTER — Other Ambulatory Visit: Payer: Self-pay | Admitting: Hematology

## 2016-08-23 DIAGNOSIS — Z0000 Encounter for general adult medical examination without abnormal findings: Secondary | ICD-10-CM

## 2016-08-23 NOTE — Telephone Encounter (Signed)
Labs placed.

## 2016-08-23 NOTE — Telephone Encounter (Signed)
Patient would like to know if she can have labs done before CPE coming up on the 24th.

## 2016-08-23 NOTE — Telephone Encounter (Signed)
LVM stating labs are in for whenever she wants to come get them done

## 2016-09-01 ENCOUNTER — Encounter: Payer: 59 | Admitting: Internal Medicine

## 2016-09-05 ENCOUNTER — Encounter: Payer: Self-pay | Admitting: Internal Medicine

## 2016-09-05 ENCOUNTER — Other Ambulatory Visit: Payer: Self-pay | Admitting: Internal Medicine

## 2016-09-05 ENCOUNTER — Other Ambulatory Visit (INDEPENDENT_AMBULATORY_CARE_PROVIDER_SITE_OTHER): Payer: 59

## 2016-09-05 ENCOUNTER — Ambulatory Visit (INDEPENDENT_AMBULATORY_CARE_PROVIDER_SITE_OTHER): Payer: 59 | Admitting: Internal Medicine

## 2016-09-05 DIAGNOSIS — Z1231 Encounter for screening mammogram for malignant neoplasm of breast: Secondary | ICD-10-CM

## 2016-09-05 DIAGNOSIS — G4700 Insomnia, unspecified: Secondary | ICD-10-CM | POA: Diagnosis not present

## 2016-09-05 DIAGNOSIS — Z0000 Encounter for general adult medical examination without abnormal findings: Secondary | ICD-10-CM

## 2016-09-05 LAB — COMPREHENSIVE METABOLIC PANEL
ALT: 11 U/L (ref 0–35)
AST: 14 U/L (ref 0–37)
Albumin: 4.1 g/dL (ref 3.5–5.2)
Alkaline Phosphatase: 34 U/L — ABNORMAL LOW (ref 39–117)
BILIRUBIN TOTAL: 0.4 mg/dL (ref 0.2–1.2)
BUN: 14 mg/dL (ref 6–23)
CO2: 26 mEq/L (ref 19–32)
Calcium: 9 mg/dL (ref 8.4–10.5)
Chloride: 107 mEq/L (ref 96–112)
Creatinine, Ser: 0.7 mg/dL (ref 0.40–1.20)
GFR: 95.27 mL/min (ref >60.00)
GLUCOSE: 101 mg/dL — AB (ref 70–99)
Potassium: 4.3 mEq/L (ref 3.5–5.1)
Sodium: 138 mEq/L (ref 135–145)
TOTAL PROTEIN: 6.9 g/dL (ref 6.0–8.3)

## 2016-09-05 LAB — CBC
HCT: 36.9 % (ref 36.0–46.0)
Hemoglobin: 12 g/dL (ref 12.0–15.0)
MCHC: 32.6 g/dL (ref 30.0–36.0)
MCV: 88.1 fl (ref 78.0–100.0)
PLATELETS: 239 10*3/uL (ref 150.0–400.0)
RBC: 4.18 Mil/uL (ref 3.87–5.11)
RDW: 14.5 % (ref 11.5–15.5)
WBC: 4.4 10*3/uL (ref 4.0–10.5)

## 2016-09-05 LAB — LIPID PANEL
Cholesterol: 155 mg/dL (ref 0–200)
HDL: 56.9 mg/dL (ref >39.00)
LDL Cholesterol: 91 mg/dL (ref 0–99)
NonHDL: 98.16
Total CHOL/HDL Ratio: 3
Triglycerides: 37 mg/dL (ref 0.0–149.0)
VLDL: 7.4 mg/dL (ref 0.0–40.0)

## 2016-09-05 LAB — HEMOGLOBIN A1C: Hgb A1c MFr Bld: 6.1 % (ref 4.6–6.5)

## 2016-09-05 LAB — TSH: TSH: 1.54 u[IU]/mL (ref 0.35–4.50)

## 2016-09-05 MED ORDER — GLUCOSE BLOOD VI STRP: ORAL_STRIP | 12 refills | Status: DC

## 2016-09-05 NOTE — Progress Notes (Signed)
   Subjective:    Patient ID: Shirley Tucker, female    DOB: [birth year], 46 y.o.   MRN: [id number]  HPI The patient is a 46 YO female coming in for wellness. No new concerns.   PMH, [facility], social history reviewed and updated.   Review of Systems  Constitutional: Negative.   HENT: Negative.   Eyes: Negative.   Respiratory: Negative for cough, chest tightness and shortness of breath.   Cardiovascular: Negative for chest pain, palpitations and leg swelling.  Gastrointestinal: Negative for abdominal distention, abdominal pain, constipation, diarrhea, nausea and vomiting.  Musculoskeletal: Negative.   Skin: Negative.   Neurological: Negative.   Psychiatric/Behavioral: Negative.       Objective:   Physical Exam  Constitutional: She is oriented to person, place, and time. She appears well-developed and well-nourished.  HENT:  Head: Normocephalic and atraumatic.  Eyes: EOM are normal.  Neck: Normal range of motion.  Cardiovascular: Normal rate and regular rhythm.   Pulmonary/Chest: Effort normal and breath sounds normal. No respiratory distress. She has no wheezes. She has no rales.  Abdominal: Soft. Bowel sounds are normal. She exhibits no distension. There is no tenderness. There is no rebound.  Musculoskeletal: She exhibits no edema.  Neurological: She is alert and oriented to person, place, and time. Coordination normal.  Skin: Skin is warm and dry.  Psychiatric: She has a normal mood and affect.   Vitals:   09/05/16 1005  BP: 110/70  Pulse: 90  Resp: 12  Temp: 98.1 F (36.7 C)  TempSrc: Oral  SpO2: 98%  Weight: 127 lb (57.6 kg)  Height: 5\' 4"  (1.626 m)      Assessment & Plan:

## 2016-09-05 NOTE — Progress Notes (Signed)
Pre visit review using our clinic review tool, if applicable. No additional management support is needed unless otherwise documented below in the visit note. 

## 2016-09-05 NOTE — Assessment & Plan Note (Signed)
Uses ambien rarely and declines need for refill today.

## 2016-09-05 NOTE — Assessment & Plan Note (Signed)
Doing well, declines HIV screening. Exercising well, non-smoker. Up to date on pap smear and mammogram. Gets flu shot yearly and tetanus up to date. Given screening recommendations.

## 2016-09-05 NOTE — Patient Instructions (Signed)
We will call you back with the lab results.   Health Maintenance, Female Adopting a healthy lifestyle and getting preventive care can go a long way to promote health and wellness. Talk with your health care provider about what schedule of regular examinations is right for you. This is a good chance for you to check in with your provider about disease prevention and staying healthy. In between checkups, there are plenty of things you can do on your own. Experts have done a lot of research about which lifestyle changes and preventive measures are most likely to keep you healthy. Ask your health care provider for more information. Weight and diet Eat a healthy diet  Be sure to include plenty of vegetables, fruits, low-fat dairy products, and lean protein.  Do not eat a lot of foods high in solid fats, added sugars, or salt.  Get regular exercise. This is one of the most important things you can do for your health.  Most adults should exercise for at least 150 minutes each week. The exercise should increase your heart rate and make you sweat (moderate-intensity exercise).  Most adults should also do strengthening exercises at least twice a week. This is in addition to the moderate-intensity exercise. Maintain a healthy weight  Body mass index (BMI) is a measurement that can be used to identify possible weight problems. It estimates body fat based on height and weight. Your health care provider can help determine your BMI and help you achieve or maintain a healthy weight.  For females 94 years of age and older:  A BMI below 18.5 is considered underweight.  A BMI of 18.5 to 24.9 is normal.  A BMI of 25 to 29.9 is considered overweight.  A BMI of 30 and above is considered obese. Watch levels of cholesterol and blood lipids  You should start having your blood tested for lipids and cholesterol at 47 years of age, then have this test every 5 years.  You may need to have your cholesterol  levels checked more often if:  Your lipid or cholesterol levels are high.  You are older than 47 years of age.  You are at high risk for heart disease. Cancer screening Lung Cancer  Lung cancer screening is recommended for adults 68-72 years old who are at high risk for lung cancer because of a history of smoking.  A yearly low-dose CT scan of the lungs is recommended for people who:  Currently smoke.  Have quit within the past 15 years.  Have at least a 30-pack-year history of smoking. A pack year is smoking an average of one pack of cigarettes a day for 1 year.  Yearly screening should continue until it has been 15 years since you quit.  Yearly screening should stop if you develop a health problem that would prevent you from having lung cancer treatment. Breast Cancer  Practice breast self-awareness. This means understanding how your breasts normally appear and feel.  It also means doing regular breast self-exams. Let your health care provider know about any changes, no matter how small.  If you are in your 20s or 30s, you should have a clinical breast exam (CBE) by a health care provider every 1-3 years as part of a regular health exam.  If you are 43 or older, have a CBE every year. Also consider having a breast X-ray (mammogram) every year.  If you have a family history of breast cancer, talk to your health care provider about genetic screening.  If you are at high risk for breast cancer, talk to your health care provider about having an MRI and a mammogram every year.  Breast cancer gene (BRCA) assessment is recommended for women who have family members with BRCA-related cancers. BRCA-related cancers include:  Breast.  Ovarian.  Tubal.  Peritoneal cancers.  Results of the assessment will determine the need for genetic counseling and BRCA1 and BRCA2 testing. Cervical Cancer  Your health care provider may recommend that you be screened regularly for cancer of the  pelvic organs (ovaries, uterus, and vagina). This screening involves a pelvic examination, including checking for microscopic changes to the surface of your cervix (Pap test). You may be encouraged to have this screening done every 3 years, beginning at age 21.  For women ages 30-65, health care providers may recommend pelvic exams and Pap testing every 3 years, or they may recommend the Pap and pelvic exam, combined with testing for human papilloma virus (HPV), every 5 years. Some types of HPV increase your risk of cervical cancer. Testing for HPV may also be done on women of any age with unclear Pap test results.  Other health care providers may not recommend any screening for nonpregnant women who are considered low risk for pelvic cancer and who do not have symptoms. Ask your health care provider if a screening pelvic exam is right for you.  If you have had past treatment for cervical cancer or a condition that could lead to cancer, you need Pap tests and screening for cancer for at least 20 years after your treatment. If Pap tests have been discontinued, your risk factors (such as having a new sexual partner) need to be reassessed to determine if screening should resume. Some women have medical problems that increase the chance of getting cervical cancer. In these cases, your health care provider may recommend more frequent screening and Pap tests. Colorectal Cancer  This type of cancer can be detected and often prevented.  Routine colorectal cancer screening usually begins at 47 years of age and continues through 47 years of age.  Your health care provider may recommend screening at an earlier age if you have risk factors for colon cancer.  Your health care provider may also recommend using home test kits to check for hidden blood in the stool.  A small camera at the end of a tube can be used to examine your colon directly (sigmoidoscopy or colonoscopy). This is done to check for the earliest  forms of colorectal cancer.  Routine screening usually begins at age 50.  Direct examination of the colon should be repeated every 5-10 years through 47 years of age. However, you may need to be screened more often if early forms of precancerous polyps or small growths are found. Skin Cancer  Check your skin from head to toe regularly.  Tell your health care provider about any new moles or changes in moles, especially if there is a change in a mole's shape or color.  Also tell your health care provider if you have a mole that is larger than the size of a pencil eraser.  Always use sunscreen. Apply sunscreen liberally and repeatedly throughout the day.  Protect yourself by wearing long sleeves, pants, a wide-brimmed hat, and sunglasses whenever you are outside. Heart disease, diabetes, and high blood pressure  High blood pressure causes heart disease and increases the risk of stroke. High blood pressure is more likely to develop in:  People who have blood pressure in the   high end of the normal range (130-139/85-89 mm Hg).  People who are overweight or obese.  People who are African American.  If you are 18-39 years of age, have your blood pressure checked every 3-5 years. If you are 40 years of age or older, have your blood pressure checked every year. You should have your blood pressure measured twice-once when you are at a hospital or clinic, and once when you are not at a hospital or clinic. Record the average of the two measurements. To check your blood pressure when you are not at a hospital or clinic, you can use:  An automated blood pressure machine at a pharmacy.  A home blood pressure monitor.  If you are between 55 years and 79 years old, ask your health care provider if you should take aspirin to prevent strokes.  Have regular diabetes screenings. This involves taking a blood sample to check your fasting blood sugar level.  If you are at a normal weight and have a low  risk for diabetes, have this test once every three years after 47 years of age.  If you are overweight and have a high risk for diabetes, consider being tested at a younger age or more often. Preventing infection Hepatitis B  If you have a higher risk for hepatitis B, you should be screened for this virus. You are considered at high risk for hepatitis B if:  You were born in a country where hepatitis B is common. Ask your health care provider which countries are considered high risk.  Your parents were born in a high-risk country, and you have not been immunized against hepatitis B (hepatitis B vaccine).  You have HIV or AIDS.  You use needles to inject street drugs.  You live with someone who has hepatitis B.  You have had sex with someone who has hepatitis B.  You get hemodialysis treatment.  You take certain medicines for conditions, including cancer, organ transplantation, and autoimmune conditions. Hepatitis C  Blood testing is recommended for:  Everyone born from 1945 through 1965.  Anyone with known risk factors for hepatitis C. Sexually transmitted infections (STIs)  You should be screened for sexually transmitted infections (STIs) including gonorrhea and chlamydia if:  You are sexually active and are younger than 47 years of age.  You are older than 47 years of age and your health care provider tells you that you are at risk for this type of infection.  Your sexual activity has changed since you were last screened and you are at an increased risk for chlamydia or gonorrhea. Ask your health care provider if you are at risk.  If you do not have HIV, but are at risk, it may be recommended that you take a prescription medicine daily to prevent HIV infection. This is called pre-exposure prophylaxis (PrEP). You are considered at risk if:  You are sexually active and do not regularly use condoms or know the HIV status of your partner(s).  You take drugs by  injection.  You are sexually active with a partner who has HIV. Talk with your health care provider about whether you are at high risk of being infected with HIV. If you choose to begin PrEP, you should first be tested for HIV. You should then be tested every 3 months for as long as you are taking PrEP. Pregnancy  If you are premenopausal and you may become pregnant, ask your health care provider about preconception counseling.  If you may become   pregnant, take 400 to 800 micrograms (mcg) of folic acid every day.  If you want to prevent pregnancy, talk to your health care provider about birth control (contraception). Osteoporosis and menopause  Osteoporosis is a disease in which the bones lose minerals and strength with aging. This can result in serious bone fractures. Your risk for osteoporosis can be identified using a bone density scan.  If you are 1 years of age or older, or if you are at risk for osteoporosis and fractures, ask your health care provider if you should be screened.  Ask your health care provider whether you should take a calcium or vitamin D supplement to lower your risk for osteoporosis.  Menopause may have certain physical symptoms and risks.  Hormone replacement therapy may reduce some of these symptoms and risks. Talk to your health care provider about whether hormone replacement therapy is right for you. Follow these instructions at home:  Schedule regular health, dental, and eye exams.  Stay current with your immunizations.  Do not use any tobacco products including cigarettes, chewing tobacco, or electronic cigarettes.  If you are pregnant, do not drink alcohol.  If you are breastfeeding, limit how much and how often you drink alcohol.  Limit alcohol intake to no more than 1 drink per day for nonpregnant women. One drink equals 12 ounces of beer, 5 ounces of wine, or 1 ounces of hard liquor.  Do not use street drugs.  Do not share needles.  Ask  your health care provider for help if you need support or information about quitting drugs.  Tell your health care provider if you often feel depressed.  Tell your health care provider if you have ever been abused or do not feel safe at home. This information is not intended to replace advice given to you by your health care provider. Make sure you discuss any questions you have with your health care provider. Document Released: 11/14/2010 Document Revised: 10/07/2015 Document Reviewed: 02/02/2015 Elsevier Interactive Patient Education  2017 [employer].

## 2016-09-26 ENCOUNTER — Ambulatory Visit
Admission: RE | Admit: 2016-09-26 | Discharge: 2016-09-26 | Disposition: A | Discharge status: Home / Self Care | Payer: 59 | Source: Ambulatory Visit | Attending: Internal Medicine | Admitting: Internal Medicine

## 2016-09-26 DIAGNOSIS — Z1231 Encounter for screening mammogram for malignant neoplasm of breast: Secondary | ICD-10-CM

## 2016-11-28 ENCOUNTER — Other Ambulatory Visit: Payer: Self-pay

## 2016-11-28 ENCOUNTER — Ambulatory Visit (HOSPITAL_COMMUNITY)
Admission: RE | Admit: 2016-11-28 | Discharge: 2016-11-28 | Disposition: A | Discharge status: Home / Self Care | Payer: 59 | Source: Ambulatory Visit | Attending: Internal Medicine | Admitting: Internal Medicine

## 2016-11-28 DIAGNOSIS — Z029 Encounter for administrative examinations, unspecified: Secondary | ICD-10-CM | POA: Insufficient documentation

## 2016-12-19 DIAGNOSIS — H5213 Myopia, bilateral: Secondary | ICD-10-CM | POA: Diagnosis not present

## 2017-01-23 ENCOUNTER — Ambulatory Visit (HOSPITAL_COMMUNITY)
Admission: RE | Admit: 2017-01-23 | Discharge: 2017-01-23 | Disposition: A | Discharge status: Home / Self Care | Payer: 59 | Source: Ambulatory Visit | Attending: Internal Medicine | Admitting: Internal Medicine

## 2017-01-23 ENCOUNTER — Encounter (HOSPITAL_COMMUNITY): Payer: Self-pay | Admitting: Internal Medicine

## 2017-01-23 VITALS — BP 110/72 | HR 86 | Wt 124.1 lb

## 2017-01-23 DIAGNOSIS — R7303 Prediabetes: Secondary | ICD-10-CM | POA: Diagnosis not present

## 2017-01-23 DIAGNOSIS — R079 Chest pain, unspecified: Secondary | ICD-10-CM | POA: Diagnosis not present

## 2017-01-23 DIAGNOSIS — Z880 Allergy status to penicillin: Secondary | ICD-10-CM | POA: Diagnosis not present

## 2017-01-23 DIAGNOSIS — R0789 Other chest pain: Secondary | ICD-10-CM | POA: Insufficient documentation

## 2017-01-23 DIAGNOSIS — K219 Gastro-esophageal reflux disease without esophagitis: Secondary | ICD-10-CM | POA: Diagnosis not present

## 2017-01-23 MED ORDER — DEXLANSOPRAZOLE 60 MG PO CPDR: 60.0000 mg | DELAYED_RELEASE_CAPSULE | Freq: Every day | ORAL | 6 refills | Status: DC

## 2017-01-23 NOTE — Progress Notes (Signed)
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   In mid-July woke up with chest tightness.   No SOB, palpitations or diaphoresis. Lasted about 2-3 hours. Took some antacids and felt better. Now on prilosec and feels some better but still with persistent tightness in lower chest. Exercises regularly with TM and swimming without any worsening of symptoms. No change in exercise tolerance. Does not take NSAID . No ETOH or smoking. Has been under stress with work. No clear reflux symptoms. No melena or biliary colic.Marland Kitchen    Review of Systems: [y] = yes, [ ]  = no   General: Weight gain [ ] ; Weight loss [ ] ; Anorexia [ ] ; Fatigue [ ] ; Fever [ ] ; Chills [ ] ; Weakness [ ]   Cardiac: Chest pain/pressure [ ] ; Resting SOB [ ] ; Exertional SOB [ ] ; Orthopnea [ ] ; Pedal Edema [ ] ; Palpitations [ ] ; Syncope [ ] ; Presyncope [ ] ; Paroxysmal nocturnal dyspnea[ ]   Pulmonary: Cough [ ] ; Wheezing[ ] ; Hemoptysis[ ] ; Sputum [ ] ; Snoring [ ]   GI: Vomiting[ ] ; Dysphagia[ ] ; Melena[ ] ; Hematochezia [ ] ; Heartburn[ y]; Abdominal pain [ ] ; Constipation [ ] ; Diarrhea [ ] ; BRBPR [ ]   GU: Hematuria[ ] ; Dysuria [ ] ; Nocturia[ ]   Vascular: Pain in legs with walking [ ] ; Pain in feet with lying flat [ ] ; Non-healing sores [ ] ; Stroke [ ] ; TIA [ ] ; Slurred speech [ ] ;  Neuro: Headaches[ ] ; Vertigo[ ] ; Seizures[ ] ; Paresthesias[ ] ;Blurred vision [ ] ; Diplopia [ ] ; Vision changes [ ]   Ortho/Skin: Arthritis [ ] ; Joint pain [ ] ; Muscle pain [ ] ; Joint swelling [ ] ; Back Pain [ ] ; Rash [ ]   Psych: Depression[ ] ; Anxiety[ ]   Heme: Bleeding problems [ ] ; Clotting disorders [ ] ; Anemia [ ]   Endocrine: Diabetes [ ] ; Thyroid dysfunction[ ]   PMHX: 1. Borderline diabetes - controlled with diet and exercise  Current Outpatient Prescriptions  Medication Sig Dispense Refill  . glucose blood (TRUE METRIX BLOOD GLUCOSE  TEST) test strip Use as instructed check sugars daily 100 each 12  . Lancet Device MISC Needs true metrix 100 each 11  . Lancets (ONETOUCH ULTRASOFT) lancets Use as instructed to check sugars daily 100 each 12  . omeprazole (PRILOSEC) 20 MG capsule Take 20 mg by mouth daily.    Marland Kitchen zolpidem (AMBIEN) 10 MG tablet TAKE 1/2 TO 1 TABLET BY MOUTH AT BEDTIME AS NEEDED FOR SLEEP 30 tablet 3   No current facility-administered medications for this encounter.     Allergies  Allergen Reactions  . Penicillins Other (See Comments)    Patient reported passing out      Social History   Social History  . Marital status: Married    Spouse name: N/A  . Number of children: N/A  . Years of education: N/A   Occupational History  . Not on file.   Social History Main Topics  . Smoking status: Never Smoker  . Smokeless tobacco: Never Used  . Alcohol use 0.0 oz/week     Comment: Social  . Drug use: No  . Sexual activity: Not on file   Other Topics Concern  . Not on file   Social History Narrative  . No narrative on file      Family History  Problem Relation Age of Onset  . Hypertension Mother   .  Dementia Mother   . Hypertension Father   . Heart disease Father   . Hypertension Maternal Grandmother   . Hypertension Maternal Grandfather    Father: AF but no CAD Mother: Dementia no CAD Sister: ok   Vitals:   01/23/17 1543  BP: 110/72  Pulse: 86  SpO2: 98%  Weight: 124 lb 1.9 oz (56.3 kg)    PHYSICAL EXAM: General:  Well appearing. No respiratory difficulty HEENT: normal Neck: supple. no JVD. Carotids 2+ bilat; no bruits. No lymphadenopathy or thryomegaly appreciated. Cor: PMI nondisplaced. Regular rate & rhythm. No rubs, gallops or murmurs. Lungs: clear Abdomen: soft, nontender, nondistended. No hepatosplenomegaly. No bruits or masses. Good bowel sounds. Extremities: no cyanosis, clubbing, rash, edema Neuro: alert & oriented x 3, cranial nerves grossly intact. moves all 4  extremities w/o difficulty. Affect pleasant.  ECG: NSR 89 No ST-T wave abnormalities.     ASSESSMENT & PLAN:  1. Chest discomfort - suspect most likely related to stress and GERD. - switch prilosec to Dexilant - we discussed possible stress echo but she would like to defer for now until we see result of increasing PPI. Will proceed if symptoms persist.  - will get CXR  Glori Bickers, MD  9:46 PM

## 2017-01-23 NOTE — Patient Instructions (Signed)
Start Dexilant 60 mg daily  Chest x-ray today  Your physician recommends that you schedule a follow-up appointment in: 2 months

## 2017-01-29 ENCOUNTER — Telehealth (HOSPITAL_COMMUNITY): Payer: Self-pay | Admitting: *Deleted

## 2017-01-29 MED ORDER — PANTOPRAZOLE SODIUM 40 MG PO TBEC: 40.0000 mg | DELAYED_RELEASE_TABLET | Freq: Every day | ORAL | 11 refills | Status: DC

## 2017-01-29 NOTE — Telephone Encounter (Signed)
Pt's insurance will not cover Dexilant, per Dr Haroldine Laws change to Protonix 40 mg daily, rx sent in, pt aware

## 2017-04-03 ENCOUNTER — Encounter (HOSPITAL_COMMUNITY): Payer: 59 | Admitting: Internal Medicine

## 2017-04-09 ENCOUNTER — Other Ambulatory Visit: Payer: Self-pay | Admitting: *Deleted

## 2017-04-09 NOTE — Patient Outreach (Addendum)
No response to secure e-mail sent to Dr. Burr Medico  on 04/02/17 regarding changes to the [facility] program in 2019 advising  that disease self-management services will be transitioned from the [facility] program to either [facility] or [facility] in 2019.  Also advised        that a letter will be mailed to the home residence with details of this transition.                                                         Will close case to [facility] diabetes program.  Will close case to the diabetes [facility] To [place] RN,CCM,CDE [place] Management Coordinator [facility] and [employer] [phone] Office Fax [phone]

## 2017-04-23 ENCOUNTER — Other Ambulatory Visit: Payer: Self-pay | Admitting: *Deleted

## 2017-04-23 NOTE — Patient Outreach (Signed)
Entry error. See note of 04/09/17 regarding case closure to the [facility] To [place] RN,CCM,CDE [place] Management Coordinator [facility] and [employer] [phone] Office Fax [phone]

## 2017-06-06 ENCOUNTER — Telehealth: Payer: Self-pay | Admitting: Gastroenterology

## 2017-06-06 DIAGNOSIS — R1310 Dysphagia, unspecified: Secondary | ICD-10-CM

## 2017-06-06 DIAGNOSIS — K219 Gastro-esophageal reflux disease without esophagitis: Secondary | ICD-10-CM

## 2017-06-06 NOTE — Telephone Encounter (Signed)
   I work with Shirley Tucker, we share many patients together.  She has also sought my opinion regarding intermittent GERD and dysphasia.  She has no weight loss that is unexplained, no overt GI bleeding.  Proton pump inhibitors do seem to help her symptoms but not 100%.   Patty, can you please get in touch with her.  She knows he will be calling sometime in the next few days to set up upper endoscopy for chronic GERD, dysphasia. [facility].  She is hoping to do this pm a Tuesday in February sometime. Thanks.

## 2017-06-06 NOTE — Telephone Encounter (Signed)
07/10/17 10 am [facility] EGD pt will be mailed the instructions.  She has been advised and instructed over the phone

## 2017-06-26 ENCOUNTER — Encounter: Payer: Self-pay | Admitting: Gastroenterology

## 2017-07-04 ENCOUNTER — Telehealth: Payer: Self-pay | Admitting: Gastroenterology

## 2017-07-04 NOTE — Telephone Encounter (Signed)
The pt appt has been cancelled. 

## 2017-07-04 NOTE — Telephone Encounter (Signed)
She has been feeling absolutely fine for the past several weeks, off meds.  No dysphagia, no weight loss.    Can you cancel her upcoming EGD please

## 2017-07-10 ENCOUNTER — Encounter: Payer: 59 | Admitting: Gastroenterology

## 2017-08-21 ENCOUNTER — Encounter: Payer: Self-pay | Admitting: Hematology

## 2017-08-22 ENCOUNTER — Other Ambulatory Visit: Payer: 59

## 2017-08-22 ENCOUNTER — Encounter: Payer: Self-pay | Admitting: Family

## 2017-08-22 ENCOUNTER — Ambulatory Visit: Payer: 59 | Admitting: Family

## 2017-08-22 VITALS — BP 108/76 | HR 85 | Temp 98.4°F | Ht 64.0 in | Wt 127.1 lb

## 2017-08-22 DIAGNOSIS — R300 Dysuria: Secondary | ICD-10-CM

## 2017-08-22 LAB — POC URINALSYSI DIPSTICK (AUTOMATED)
Bilirubin, UA: NEGATIVE
GLUCOSE UA: NEGATIVE
Ketones, UA: 1
Nitrite, UA: NEGATIVE
SPEC GRAV UA: 1.025 (ref 1.010–1.025)
UROBILINOGEN UA: 0.2 U/dL
pH, UA: 6 (ref 5.0–8.0)

## 2017-08-22 MED ORDER — CIPROFLOXACIN HCL 500 MG PO TABS: 500.0000 mg | ORAL_TABLET | Freq: Two times a day (BID) | ORAL | 0 refills | Status: DC

## 2017-08-22 NOTE — Progress Notes (Signed)
  Shirley Tucker is a 47 y.o. female with the following history as recorded in EpicCare:  Patient Active Problem List   Diagnosis Date Noted  . Routine general medical examination at a health care facility 07/03/2014  . Insomnia 07/03/2014    Current Outpatient Medications  Medication Sig Dispense Refill  . ciprofloxacin (CIPRO) 500 MG tablet Take 1 tablet (500 mg total) by mouth 2 (two) times daily. 10 tablet 0  . glucose blood (TRUE METRIX BLOOD GLUCOSE TEST) test strip Use as instructed check sugars daily 100 each 12  . Lancet Device MISC Needs true metrix 100 each 11  . Lancets (ONETOUCH ULTRASOFT) lancets Use as instructed to check sugars daily 100 each 12   No current facility-administered medications for this visit.     Allergies: Penicillins  History reviewed. No pertinent past medical history.  Past Surgical History:  Procedure Laterality Date  . KNEE SURGERY Bilateral 1992    Family History  Problem Relation Age of Onset  . Hypertension Mother   . Dementia Mother   . Hypertension Father   . Heart disease Father   . Hypertension Maternal Grandmother   . Hypertension Maternal Grandfather     Social History   Tobacco Use  . Smoking status: Never Smoker  . Smokeless tobacco: Never Used  Substance Use Topics  . Alcohol use: Yes    Alcohol/week: 0.0 oz    Comment: Social    Subjective:   Patient presents with concerns for UTI; + burning, frequency, urgency x 2 days; has seen blood in her urine; + pelvic pressure; denies any fever or back pain;      Objective:  Vitals:   08/22/17 1611  BP: 108/76  Pulse: 85  Temp: 98.4 F (36.9 C)  TempSrc: Oral  SpO2: 97%  Weight: 127 lb 1.9 oz (57.7 kg)  Height: 5\' 4"  (1.626 m)    General: Well developed, well nourished, in no acute distress  Skin : Warm and dry.  Lungs: Respirations unlabored; clear to auscultation bilaterally without wheeze, rales, rhonchi  CVS exam: normal rate and regular rhythm.  Musculoskeletal: No  deformities; no active joint inflammation; negative CVA tenderness Neurologic: Alert and oriented; speech intact; face symmetrical; moves all extremities well; CNII-XII intact without focal deficit  Assessment:  1. Dysuria     Plan:  Suspect UTI; check U/A and urine culture; Rx for Cipro 500 mg bid x 5 days; increase fluids, rest and follow-up worse, no better.    No follow-ups on file.  Orders Placed This Encounter  Procedures  . Urine Culture    Standing Status:   Future    Number of Occurrences:   1    Standing Expiration Date:   08/22/2018  . POCT Urinalysis Dipstick (Automated)    Requested Prescriptions   Signed Prescriptions Disp Refills  . ciprofloxacin (CIPRO) 500 MG tablet 10 tablet 0    Sig: Take 1 tablet (500 mg total) by mouth 2 (two) times daily.

## 2017-08-24 LAB — URINE CULTURE
MICRO NUMBER: 90442440
SPECIMEN QUALITY:: ADEQUATE

## 2017-08-28 ENCOUNTER — Other Ambulatory Visit: Payer: Self-pay | Admitting: Internal Medicine

## 2017-08-28 DIAGNOSIS — Z1231 Encounter for screening mammogram for malignant neoplasm of breast: Secondary | ICD-10-CM

## 2017-09-11 ENCOUNTER — Encounter: Payer: 59 | Admitting: Internal Medicine

## 2017-09-25 ENCOUNTER — Encounter: Payer: 59 | Admitting: Internal Medicine

## 2017-10-01 ENCOUNTER — Ambulatory Visit: Payer: 59

## 2017-10-02 ENCOUNTER — Other Ambulatory Visit (HOSPITAL_COMMUNITY)
Admission: RE | Admit: 2017-10-02 | Discharge: 2017-10-02 | Disposition: A | Discharge status: Home / Self Care | Payer: 59 | Source: Ambulatory Visit | Attending: Internal Medicine | Admitting: Internal Medicine

## 2017-10-02 ENCOUNTER — Other Ambulatory Visit (INDEPENDENT_AMBULATORY_CARE_PROVIDER_SITE_OTHER): Payer: 59

## 2017-10-02 ENCOUNTER — Ambulatory Visit
Admission: RE | Admit: 2017-10-02 | Discharge: 2017-10-02 | Disposition: A | Discharge status: Home / Self Care | Payer: 59 | Source: Ambulatory Visit | Attending: Internal Medicine | Admitting: Internal Medicine

## 2017-10-02 ENCOUNTER — Ambulatory Visit (INDEPENDENT_AMBULATORY_CARE_PROVIDER_SITE_OTHER): Payer: 59 | Admitting: Internal Medicine

## 2017-10-02 ENCOUNTER — Encounter: Payer: Self-pay | Admitting: Internal Medicine

## 2017-10-02 VITALS — BP 100/68 | HR 72 | Temp 98.5°F | Ht 64.0 in | Wt 127.0 lb

## 2017-10-02 DIAGNOSIS — G4700 Insomnia, unspecified: Secondary | ICD-10-CM | POA: Diagnosis not present

## 2017-10-02 DIAGNOSIS — Z0000 Encounter for general adult medical examination without abnormal findings: Secondary | ICD-10-CM

## 2017-10-02 DIAGNOSIS — M25511 Pain in right shoulder: Secondary | ICD-10-CM

## 2017-10-02 DIAGNOSIS — Z124 Encounter for screening for malignant neoplasm of cervix: Secondary | ICD-10-CM

## 2017-10-02 DIAGNOSIS — Z1231 Encounter for screening mammogram for malignant neoplasm of breast: Secondary | ICD-10-CM | POA: Diagnosis not present

## 2017-10-02 LAB — COMPREHENSIVE METABOLIC PANEL
ALBUMIN: 4.1 g/dL (ref 3.5–5.2)
ALT: 10 U/L (ref 0–35)
AST: 13 U/L (ref 0–37)
Alkaline Phosphatase: 33 U/L — ABNORMAL LOW (ref 39–117)
BUN: 15 mg/dL (ref 6–23)
CALCIUM: 8.9 mg/dL (ref 8.4–10.5)
CHLORIDE: 105 meq/L (ref 96–112)
CO2: 28 mEq/L (ref 19–32)
CREATININE: 0.67 mg/dL (ref 0.40–1.20)
GFR: 99.75 mL/min (ref >60.00)
Glucose, Bld: 109 mg/dL — ABNORMAL HIGH (ref 70–99)
Potassium: 3.7 mEq/L (ref 3.5–5.1)
Sodium: 139 mEq/L (ref 135–145)
Total Bilirubin: 0.5 mg/dL (ref 0.2–1.2)
Total Protein: 7.2 g/dL (ref 6.0–8.3)

## 2017-10-02 LAB — LIPID PANEL
CHOLESTEROL: 157 mg/dL (ref 0–200)
HDL: 57.1 mg/dL (ref >39.00)
LDL CALC: 90 mg/dL (ref 0–99)
NonHDL: 99.85
TRIGLYCERIDES: 49 mg/dL (ref 0.0–149.0)
Total CHOL/HDL Ratio: 3
VLDL: 9.8 mg/dL (ref 0.0–40.0)

## 2017-10-02 LAB — CBC
HCT: 36.1 % (ref 36.0–46.0)
Hemoglobin: 11.6 g/dL — ABNORMAL LOW (ref 12.0–15.0)
MCHC: 32.1 g/dL (ref 30.0–36.0)
MCV: 86 fl (ref 78.0–100.0)
PLATELETS: 237 10*3/uL (ref 150.0–400.0)
RBC: 4.19 Mil/uL (ref 3.87–5.11)
RDW: 15 % (ref 11.5–15.5)
WBC: 3.7 10*3/uL — AB (ref 4.0–10.5)

## 2017-10-02 LAB — VITAMIN D 25 HYDROXY (VIT D DEFICIENCY, FRACTURES): VITD: 21.38 ng/mL — AB (ref 30.00–100.00)

## 2017-10-02 LAB — TSH: TSH: 1.52 u[IU]/mL (ref 0.35–4.50)

## 2017-10-02 LAB — HEMOGLOBIN A1C: HEMOGLOBIN A1C: 6.1 % (ref 4.6–6.5)

## 2017-10-02 MED ORDER — BLOOD GLUCOSE METER KIT: PACK | 0 refills | Status: DC

## 2017-10-02 MED ORDER — ZOLPIDEM TARTRATE 10 MG PO TABS: 10.0000 mg | ORAL_TABLET | Freq: Every evening | ORAL | 1 refills | Status: DC | PRN

## 2017-10-02 NOTE — Patient Instructions (Signed)
We have done the pap smear and will call you back about the results.   The pregnancy test is negative today.     Health Maintenance, Female Adopting a healthy lifestyle and getting preventive care can go a long way to promote health and wellness. Talk with your health care provider about what schedule of regular examinations is right for you. This is a good chance for you to check in with your provider about disease prevention and staying healthy. In between checkups, there are plenty of things you can do on your own. Experts have done a lot of research about which lifestyle changes and preventive measures are most likely to keep you healthy. Ask your health care provider for more information. Weight and diet Eat a healthy diet  Be sure to include plenty of vegetables, fruits, low-fat dairy products, and lean protein.  Do not eat a lot of foods high in solid fats, added sugars, or salt.  Get regular exercise. This is one of the most important things you can do for your health. ? Most adults should exercise for at least 150 minutes each week. The exercise should increase your heart rate and make you sweat (moderate-intensity exercise). ? Most adults should also do strengthening exercises at least twice a week. This is in addition to the moderate-intensity exercise.  Maintain a healthy weight  Body mass index (BMI) is a measurement that can be used to identify possible weight problems. It estimates body fat based on height and weight. Your health care provider can help determine your BMI and help you achieve or maintain a healthy weight.  For females 52 years of age and older: ? A BMI below 18.5 is considered underweight. ? A BMI of 18.5 to 24.9 is normal. ? A BMI of 25 to 29.9 is considered overweight. ? A BMI of 30 and above is considered obese.  Watch levels of cholesterol and blood lipids  You should start having your blood tested for lipids and cholesterol at 48 years of age, then  have this test every 5 years.  You may need to have your cholesterol levels checked more often if: ? Your lipid or cholesterol levels are high. ? You are older than 48 years of age. ? You are at high risk for heart disease.  Cancer screening Lung Cancer  Lung cancer screening is recommended for adults 43-22 years old who are at high risk for lung cancer because of a history of smoking.  A yearly low-dose CT scan of the lungs is recommended for people who: ? Currently smoke. ? Have quit within the past 15 years. ? Have at least a 30-pack-year history of smoking. A pack year is smoking an average of one pack of cigarettes a day for 1 year.  Yearly screening should continue until it has been 15 years since you quit.  Yearly screening should stop if you develop a health problem that would prevent you from having lung cancer treatment.  Breast Cancer  Practice breast self-awareness. This means understanding how your breasts normally appear and feel.  It also means doing regular breast self-exams. Let your health care provider know about any changes, no matter how small.  If you are in your 20s or 30s, you should have a clinical breast exam (CBE) by a health care provider every 1-3 years as part of a regular health exam.  If you are 72 or older, have a CBE every year. Also consider having a breast X-ray (mammogram) every year.  If you have a family history of breast cancer, talk to your health care provider about genetic screening.  If you are at high risk for breast cancer, talk to your health care provider about having an MRI and a mammogram every year.  Breast cancer gene (BRCA) assessment is recommended for women who have family members with BRCA-related cancers. BRCA-related cancers include: ? Breast. ? Ovarian. ? Tubal. ? Peritoneal cancers.  Results of the assessment will determine the need for genetic counseling and BRCA1 and BRCA2 testing.  Cervical Cancer Your health  care provider may recommend that you be screened regularly for cancer of the pelvic organs (ovaries, uterus, and vagina). This screening involves a pelvic examination, including checking for microscopic changes to the surface of your cervix (Pap test). You may be encouraged to have this screening done every 3 years, beginning at age 21.  For women ages 30-65, health care providers may recommend pelvic exams and Pap testing every 3 years, or they may recommend the Pap and pelvic exam, combined with testing for human papilloma virus (HPV), every 5 years. Some types of HPV increase your risk of cervical cancer. Testing for HPV may also be done on women of any age with unclear Pap test results.  Other health care providers may not recommend any screening for nonpregnant women who are considered low risk for pelvic cancer and who do not have symptoms. Ask your health care provider if a screening pelvic exam is right for you.  If you have had past treatment for cervical cancer or a condition that could lead to cancer, you need Pap tests and screening for cancer for at least 20 years after your treatment. If Pap tests have been discontinued, your risk factors (such as having a new sexual partner) need to be reassessed to determine if screening should resume. Some women have medical problems that increase the chance of getting cervical cancer. In these cases, your health care provider may recommend more frequent screening and Pap tests.  Colorectal Cancer  This type of cancer can be detected and often prevented.  Routine colorectal cancer screening usually begins at 48 years of age and continues through 48 years of age.  Your health care provider may recommend screening at an earlier age if you have risk factors for colon cancer.  Your health care provider may also recommend using home test kits to check for hidden blood in the stool.  A small camera at the end of a tube can be used to examine your colon  directly (sigmoidoscopy or colonoscopy). This is done to check for the earliest forms of colorectal cancer.  Routine screening usually begins at age 50.  Direct examination of the colon should be repeated every 5-10 years through 48 years of age. However, you may need to be screened more often if early forms of precancerous polyps or small growths are found.  Skin Cancer  Check your skin from head to toe regularly.  Tell your health care provider about any new moles or changes in moles, especially if there is a change in a mole's shape or color.  Also tell your health care provider if you have a mole that is larger than the size of a pencil eraser.  Always use sunscreen. Apply sunscreen liberally and repeatedly throughout the day.  Protect yourself by wearing long sleeves, pants, a wide-brimmed hat, and sunglasses whenever you are outside.  Heart disease, diabetes, and high blood pressure  High blood pressure causes heart disease and   increases the risk of stroke. High blood pressure is more likely to develop in: ? People who have blood pressure in the high end of the normal range (130-139/85-89 mm Hg). ? People who are overweight or obese. ? People who are African American.  If you are 34-35 years of age, have your blood pressure checked every 3-5 years. If you are 32 years of age or older, have your blood pressure checked every year. You should have your blood pressure measured twice-once when you are at a hospital or clinic, and once when you are not at a hospital or clinic. Record the average of the two measurements. To check your blood pressure when you are not at a hospital or clinic, you can use: ? An automated blood pressure machine at a pharmacy. ? A home blood pressure monitor.  If you are between 25 years and 42 years old, ask your health care provider if you should take aspirin to prevent strokes.  Have regular diabetes screenings. This involves taking a blood sample to  check your fasting blood sugar level. ? If you are at a normal weight and have a low risk for diabetes, have this test once every three years after 48 years of age. ? If you are overweight and have a high risk for diabetes, consider being tested at a younger age or more often. Preventing infection Hepatitis B  If you have a higher risk for hepatitis B, you should be screened for this virus. You are considered at high risk for hepatitis B if: ? You were born in a country where hepatitis B is common. Ask your health care provider which countries are considered high risk. ? Your parents were born in a high-risk country, and you have not been immunized against hepatitis B (hepatitis B vaccine). ? You have HIV or AIDS. ? You use needles to inject street drugs. ? You live with someone who has hepatitis B. ? You have had sex with someone who has hepatitis B. ? You get hemodialysis treatment. ? You take certain medicines for conditions, including cancer, organ transplantation, and autoimmune conditions.  Hepatitis C  Blood testing is recommended for: ? Everyone born from [birth year] through 1966. ? Anyone with known risk factors for hepatitis C.  Sexually transmitted infections (STIs)  You should be screened for sexually transmitted infections (STIs) including gonorrhea and chlamydia if: ? You are sexually active and are younger than 48 years of age. ? You are older than 48 years of age and your health care provider tells you that you are at risk for this type of infection. ? Your sexual activity has changed since you were last screened and you are at an increased risk for chlamydia or gonorrhea. Ask your health care provider if you are at risk.  If you do not have HIV, but are at risk, it may be recommended that you take a prescription medicine daily to prevent HIV infection. This is called pre-exposure prophylaxis (PrEP). You are considered at risk if: ? You are sexually active and do not regularly  use condoms or know the HIV status of your partner(s). ? You take drugs by injection. ? You are sexually active with a partner who has HIV.  Talk with your health care provider about whether you are at high risk of being infected with HIV. If you choose to begin PrEP, you should first be tested for HIV. You should then be tested every 3 months for as long as you are taking  PrEP. Pregnancy  If you are premenopausal and you may become pregnant, ask your health care provider about preconception counseling.  If you may become pregnant, take 400 to 800 micrograms (mcg) of folic acid every day.  If you want to prevent pregnancy, talk to your health care provider about birth control (contraception). Osteoporosis and menopause  Osteoporosis is a disease in which the bones lose minerals and strength with aging. This can result in serious bone fractures. Your risk for osteoporosis can be identified using a bone density scan.  If you are 24 years of age or older, or if you are at risk for osteoporosis and fractures, ask your health care provider if you should be screened.  Ask your health care provider whether you should take a calcium or vitamin D supplement to lower your risk for osteoporosis.  Menopause may have certain physical symptoms and risks.  Hormone replacement therapy may reduce some of these symptoms and risks. Talk to your health care provider about whether hormone replacement therapy is right for you. Follow these instructions at home:  Schedule regular health, dental, and eye exams.  Stay current with your immunizations.  Do not use any tobacco products including cigarettes, chewing tobacco, or electronic cigarettes.  If you are pregnant, do not drink alcohol.  If you are breastfeeding, limit how much and how often you drink alcohol.  Limit alcohol intake to no more than 1 drink per day for nonpregnant women. One drink equals 12 ounces of beer, 5 ounces of wine, or 1 ounces  of hard liquor.  Do not use street drugs.  Do not share needles.  Ask your health care provider for help if you need support or information about quitting drugs.  Tell your health care provider if you often feel depressed.  Tell your health care provider if you have ever been abused or do not feel safe at home. This information is not intended to replace advice given to you by your health care provider. Make sure you discuss any questions you have with your health care provider. Document Released: 11/14/2010 Document Revised: 10/07/2015 Document Reviewed: 02/02/2015 Elsevier Interactive Patient Education  [employer].

## 2017-10-02 NOTE — Assessment & Plan Note (Signed)
Refill ambien. She is aware of the risk of dependence and uses only rarely. About 30 per year.

## 2017-10-02 NOTE — Assessment & Plan Note (Signed)
Flu shot yearly. Tetanus up to date.  Mammogram up to date, pap smear collected. Counseled about sun safety and mole surveillance. Counseled about the dangers of distracted driving. Given 10 year screening recommendations.

## 2017-10-02 NOTE — Progress Notes (Signed)
   Subjective:    Patient ID: Shirley Tucker, female    DOB: 1971/01/28, 47 y.o.   MRN: [id number]  HPI The patient is a 47 YO female coming in for wellness. No new concerns.  PMH, [facility], social history reviewed and updated.   Review of Systems  Constitutional: Negative.   HENT: Negative.   Eyes: Negative.   Respiratory: Negative for cough, chest tightness and shortness of breath.   Cardiovascular: Negative for chest pain, palpitations and leg swelling.  Gastrointestinal: Negative for abdominal distention, abdominal pain, constipation, diarrhea, nausea and vomiting.  Musculoskeletal: Negative.   Skin: Negative.   Neurological: Negative.   Psychiatric/Behavioral: Negative.       Objective:   Physical Exam  Constitutional: She is oriented to person, place, and time. She appears well-developed and well-nourished.  HENT:  Head: Normocephalic and atraumatic.  Eyes: EOM are normal.  Neck: Normal range of motion.  Cardiovascular: Normal rate and regular rhythm.  Pulmonary/Chest: Effort normal and breath sounds normal. No respiratory distress. She has no wheezes. She has no rales.  Abdominal: Soft. Bowel sounds are normal. She exhibits no distension. There is no tenderness. There is no rebound.  Musculoskeletal: She exhibits no edema.  Neurological: She is alert and oriented to person, place, and time. Coordination normal.  Skin: Skin is warm and dry.  Psychiatric: She has a normal mood and affect.   Vitals:   10/02/17 0805  BP: 100/68  Pulse: 72  Temp: 98.5 F (36.9 C)  TempSrc: Oral  SpO2: 97%  Weight: 127 lb (57.6 kg)  Height: 5\' 4"  (1.626 m)      Assessment & Plan:

## 2017-10-02 NOTE — Addendum Note (Signed)
Addended by: Pricilla Holm A on: 10/02/2017 09:13 AM   Modules accepted: Orders

## 2017-10-04 LAB — CYTOLOGY - PAP: HPV: NOT DETECTED

## 2017-10-05 ENCOUNTER — Other Ambulatory Visit: Payer: Self-pay | Admitting: Internal Medicine

## 2017-10-05 DIAGNOSIS — R7301 Impaired fasting glucose: Secondary | ICD-10-CM

## 2017-10-23 ENCOUNTER — Ambulatory Visit: Payer: 59 | Admitting: Registered"

## 2017-10-23 ENCOUNTER — Encounter: Payer: Self-pay | Admitting: Registered"

## 2017-10-23 ENCOUNTER — Encounter: Payer: 59 | Attending: Internal Medicine | Admitting: Registered"

## 2017-10-23 DIAGNOSIS — R7303 Prediabetes: Secondary | ICD-10-CM | POA: Insufficient documentation

## 2017-10-23 DIAGNOSIS — Z713 Dietary counseling and surveillance: Secondary | ICD-10-CM | POA: Diagnosis not present

## 2017-10-23 NOTE — Progress Notes (Signed)
  Medical Nutrition Therapy:  Appt start time: 10:10 end time:  10:51.   Assessment:  Primary concerns today: Pt states she is prediabetic. Pt states recent A1C is 6.1.   Pt expectations: a few practical tips to improve diet and lose 5 lbs; goal is ~120 currently averaging ~126-129 lbs.   Pt states she eats brown rice and only eats bread at breakfast. Pt states she eats pasta sometimes. Pt states she eats mostly seafood and occasional red meat.  1/2 red meat, mostly seafood.   Preferred Learning Style:   No preference indicated   Learning Readiness:   Ready  Change in progress   MEDICATIONS: See list   DIETARY INTAKE:  Usual eating pattern includes 3 meals and 2-3 snacks per day.  Everyday foods include brown rice, beans, seafood, fruit, vegetables.  Avoided foods include white bread, white rice, sweets.    24-hr recall:  B ( AM): rice, beans, whole bread or french toast or fried egg or 1/4 muffin or rice and meat mixed together Snk (10-11 AM): crackers or nuts L (1 PM): salad + meat + rice/pasta Snk (4 PM): sometimes chocolate or fruit or crackers D (6-7 PM): stir fry (vegetables, meat, rice) Snk ( PM): fruit Beverages: chinese unsweetened tea, water  Usual physical activity: treadmill, biking 20-30 min 3-4x/week; swimming/yoga 30-45 min, 1x/week; sometimes biking 4-5 miles on weekend  Estimated energy needs: 1800 calories 200 g carbohydrates 135 g protein 50 g fat  Progress Towards Goal(s):  In progress.   Nutritional Diagnosis:  NB-1.1 Food and nutrition-related knowledge deficit As related to prediabetes.  As evidenced by observations and pt questions related to daily carbohydrate intake.    Intervention:  Nutrition education and counseling. Pt was educated and counseled on the benefits of increasing strengthening exercises, pairing carbohydrate with protein when eating, as well as choosing fiber-rich options to eat. Pt was in agreement with goals listed.   Goals: - Aim to add in strength-training exercises to workout regimen. Look into [employer].  - Aim to have protein along with carbohydrates as snack.  - Keep up the great work with what you are already doing!  Teaching Method Utilized:  Visual Auditory Hands on  Handouts given during visit include:  MyPlate   Carbohydrate Counting card  Barriers to learning/adherence to lifestyle change: none identified  Demonstrated degree of understanding via:  Teach Back   Monitoring/Evaluation:  Dietary intake, exercise, and body weight prn.

## 2017-10-23 NOTE — Patient Instructions (Signed)
-   Aim to add in strength-training exercises to workout regimen. Look into [employer].   - Aim to have protein along with carbohydrates as snack.   - Keep up the great work with what you are already doing!

## 2018-02-20 ENCOUNTER — Other Ambulatory Visit: Payer: Self-pay | Admitting: Hematology

## 2018-03-19 ENCOUNTER — Ambulatory Visit: Payer: 59 | Attending: Internal Medicine

## 2018-03-19 ENCOUNTER — Other Ambulatory Visit: Payer: Self-pay

## 2018-03-19 DIAGNOSIS — R293 Abnormal posture: Secondary | ICD-10-CM | POA: Diagnosis not present

## 2018-03-19 DIAGNOSIS — R252 Cramp and spasm: Secondary | ICD-10-CM

## 2018-03-19 DIAGNOSIS — G8929 Other chronic pain: Secondary | ICD-10-CM | POA: Diagnosis not present

## 2018-03-19 DIAGNOSIS — M6281 Muscle weakness (generalized): Secondary | ICD-10-CM | POA: Diagnosis not present

## 2018-03-19 DIAGNOSIS — M25511 Pain in right shoulder: Secondary | ICD-10-CM | POA: Insufficient documentation

## 2018-03-19 NOTE — Patient Instructions (Signed)
Access Code: [email or web address]  URL: [email or web address]  Date: 03/19/2018  Prepared by: Sigurd Sos   Exercises  Shoulder External Rotation and Scapular Retraction with Resistance - 10 reps - 2 sets - 2x daily - 7x weekly  Scapular Retraction with Resistance - 10 reps - 3 sets - 2x daily - 7x weekly  Scapular Retraction with Resistance Advanced - 10 reps - 3 sets - 2x daily - 7x weekly  Doorway Pec Stretch at 90 Degrees Abduction - 5 reps - 15 hold - 3x daily - 7x weekly  Seated Correct Posture - 10 reps - 1x daily - 7x weekly

## 2018-03-19 NOTE — Therapy (Signed)
[facility] [address] [place], [place], [address] Phone: [phone]   Fax:  [phone]  Physical Therapy Evaluation  Patient Details  Name: Shirley Tucker MRN: [id number] Date of Birth: [birth year] Referring Provider (PT): Pricilla Holm, MD   Encounter Date: 03/19/2018  PT End of Session - 03/19/18 1015    Visit Number  1    Date for PT Re-Evaluation  05/14/18    Authorization Type  UMR    PT Start Time  0932    PT Stop Time  1009    PT Time Calculation (min)  37 min    Activity Tolerance  Patient tolerated treatment well    Behavior During Therapy  [facility] for tasks assessed/performed       History reviewed. No pertinent past medical history.  Past Surgical History:  Procedure Laterality Date  . KNEE SURGERY Bilateral 1992    There were no vitals filed for this visit.   Subjective Assessment - 03/19/18 0943    Subjective  Pt presents to PT with complaints of Rt shoulder pain lasting >8 months without incident or injury. Pt swims regularly and reports that she has some Rt shoulder pain after swimming but not during.      Pertinent History  none    Diagnostic tests  none    Patient Stated Goals  reduce Rt arm pain    Currently in Pain?  No/denies   pt reports that shoulder is bothersome but not painful        [facility] PT Assessment - 03/19/18 0001      Assessment   Medical Diagnosis  acute Rt shoulder     Referring Provider (PT)  Pricilla Holm, MD    Onset Date/Surgical Date  07/17/17    Hand Dominance  Right    Next MD Visit  none    Prior Therapy  none      Precautions   Precautions  None      Restrictions   Weight Bearing Restrictions  No      Balance Screen   Has the patient fallen in the past 6 months  No    Has the patient had a decrease in activity level because of a fear of falling?   No    Is the patient reluctant to leave their home because of a fear of falling?   No      Home  Film/video editor residence      Prior Function   Level of Independence  Independent    Vocation  Full time employment    Vocation Requirements  MD at [employer]   Overall Cognitive Status  Within Functional Limits for tasks assessed      Observation/Other Assessments   Focus on Therapeutic Outcomes (FOTO)   28% limitation      Posture/Postural Control   Posture/Postural Control  Postural limitations    Postural Limitations  Rounded Shoulders;Flexed trunk;Forward head      ROM / Strength   AROM / PROM / Strength  AROM;PROM;Strength      AROM   Overall AROM   Within functional limits for tasks performed    Overall AROM Comments  Rt=Lt without Rt shoulder pain      PROM   Overall PROM   Within functional limits for tasks performed      Strength   Overall Strength  Deficits    Overall Strength Comments  Rt=Lt    Strength Assessment Site  Shoulder    Right/Left Shoulder  Right;Left    Right Shoulder Flexion  4/5    Right Shoulder Extension  4+/5    Right Shoulder ABduction  4-/5    Right Shoulder Internal Rotation  4+/5    Right Shoulder External Rotation  4+/5    Left Shoulder Flexion  4/5    Left Shoulder Extension  4+/5    Left Shoulder ABduction  4-/5    Left Shoulder Internal Rotation  4+/5    Left Shoulder External Rotation  4+/5      Palpation   Palpation comment  trigger points over bil upper traps and external rotators on the Rt      Transfers   Transfers  Sit to Stand      Ambulation/Gait   Gait Pattern  Within Functional Limits                Objective measurements completed on examination: See above findings.              PT Education - 03/19/18 1002    Education Details   Access Code: [email or web address]     Person(s) Educated  Patient    Methods  Explanation;Demonstration    Comprehension  Verbalized understanding;Returned demonstration       PT Short Term Goals -  03/19/18 1200      PT SHORT TERM GOAL #1   Title  be independent in initial HEP    Time  4    Period  Weeks    Status  New    Target Date  04/16/18      PT SHORT TERM GOAL #2   Title  report a 25% reduction in Rt arm soreness after swimming    Time  4    Period  Weeks    Status  New    Target Date  04/16/18        PT Long Term Goals - 03/19/18 1016      PT LONG TERM GOAL #1   Title  be independent in advanced HEP    Time  8    Period  Weeks    Status  New    Target Date  05/14/18      PT LONG TERM GOAL #2   Title  reduce FOTO to < or = to 24% limitation    Time  8    Period  Weeks    Status  New    Target Date  05/14/18      PT LONG TERM GOAL #3   Title  report a 60% reduction in Rt arm soreness after swimming    Time  8    Period  Weeks    Status  New    Target Date  05/14/18      PT LONG TERM GOAL #4   Title  demonstate neutral seated posture and report postural corrections at work    Time  8    Period  Weeks    Status  New    Target Date  05/14/18      PT LONG TERM GOAL #5   Title  demonstrate 4+/5 Rt shoulder strength throughout to improve endurance with swimming    Time  8    Period  Weeks    Status  New    Target Date  05/14/18  Plan - 03/19/18 1155    Clinical Impression Statement  Pt is a Rt hand dominant female who presents to PT with 8 month history of Rt shoulder pain without incident or injury.  Pt swims regularly and reports that she has discomfort after swimming and notices discomfort with writing and mousing at work.  Pt demonstrates poor posture with rounded shoulders and flexed trunk and weak abdominals in sitting.  Pt with full Rt shoulder A/ROM.  Bil shoulder strength, core strength and postural strength is limited.  Pt with trigger points in bil upper traps and Rt posterior shoulder external rotators.  Pt will benefit from skilled PT for postural education, strength (rotator cuff and postural), pec stretching and dry  needling.      History and Personal Factors relevant to plan of care:  none    Clinical Presentation  Stable    Clinical Presentation due to:  chronic Rt shoulder pain    Clinical Decision Making  Low    Rehab Potential  Good    PT Frequency  2x / week   1x/wk likely   PT Duration  8 weeks    PT Treatment/Interventions  ADLs/Self Care Home Management;Cryotherapy;Electrical Stimulation;Moist Heat;Therapeutic activities;Therapeutic exercise;Patient/family education;Neuromuscular re-education;Manual techniques;Passive range of motion;Taping;Dry needling    PT Next Visit Plan  Dry needling to Rt shoulder and upper trap, RTC strength, review HEP, stretch over foam roll    PT Home Exercise Plan  Access Code: [email or web address]    Consulted and Agree with Plan of Care  Patient       Patient will benefit from skilled therapeutic intervention in order to improve the following deficits and impairments:  Pain, Decreased activity tolerance, Decreased strength, Postural dysfunction, Increased muscle spasms, Improper body mechanics  Visit Diagnosis: Chronic right shoulder pain - Plan: PT plan of care cert/re-cert  Muscle weakness (generalized) - Plan: PT plan of care cert/re-cert  Cramp and spasm - Plan: PT plan of care cert/re-cert  Abnormal posture - Plan: PT plan of care cert/re-cert     Problem List Patient Active Problem List   Diagnosis Date Noted  . Routine general medical examination at a health care facility 07/03/2014  . Insomnia 07/03/2014   Sigurd Sos, PT 03/19/18 12:33 PM  [facility] [address], [place], [place], [address] Phone: [phone]   Fax:  [phone]  Name: Aysel Gilchrest MRN: [id number] Date of Birth: Nov 14, 1970

## 2018-03-25 ENCOUNTER — Telehealth (HOSPITAL_COMMUNITY): Payer: Self-pay | Admitting: *Deleted

## 2018-03-25 DIAGNOSIS — R079 Chest pain, unspecified: Secondary | ICD-10-CM

## 2018-03-26 ENCOUNTER — Ambulatory Visit: Payer: 59

## 2018-03-26 DIAGNOSIS — R252 Cramp and spasm: Secondary | ICD-10-CM | POA: Diagnosis not present

## 2018-03-26 DIAGNOSIS — R293 Abnormal posture: Secondary | ICD-10-CM

## 2018-03-26 DIAGNOSIS — G8929 Other chronic pain: Secondary | ICD-10-CM

## 2018-03-26 DIAGNOSIS — M6281 Muscle weakness (generalized): Secondary | ICD-10-CM

## 2018-03-26 DIAGNOSIS — M25511 Pain in right shoulder: Principal | ICD-10-CM

## 2018-03-26 NOTE — Telephone Encounter (Signed)
Per Dr Haroldine Laws pt needs stress echo for eval of chest discomfort/pain, pt stated she needs an appt on a Tue, order placed, will arrange

## 2018-03-26 NOTE — Therapy (Signed)
[facility] [address] [place], [place], [address] Phone: [phone]   Fax:  [phone]  Physical Therapy Treatment  Patient Details  Name: Shirley Tucker MRN: [id number] Date of Birth: [birth year] Referring Provider (PT): Pricilla Holm, MD   Encounter Date: 03/26/2018  PT End of Session - 03/26/18 1101    Visit Number  2    Date for PT Re-Evaluation  05/14/18    Authorization Type  UMR    PT Start Time  1018    PT Stop Time  1100    PT Time Calculation (min)  42 min    Activity Tolerance  Patient tolerated treatment well    Behavior During Therapy  [facility] for tasks assessed/performed       History reviewed. No pertinent past medical history.  Past Surgical History:  Procedure Laterality Date  . KNEE SURGERY Bilateral 1992    There were no vitals filed for this visit.  Subjective Assessment - 03/26/18 1020    Subjective  I am working on my posture and doing my exercises.      Patient Stated Goals  reduce Rt arm pain    Currently in Pain?  No/denies                       [facility] Adult PT Treatment/Exercise - 03/26/18 0001      Exercises   Exercises  Shoulder      Shoulder Exercises: Seated   Elevation  Strengthening;Both;10 reps    External Rotation  Strengthening;Both;20 reps    Theraband Level (Shoulder External Rotation)  Level 2 (Red)    Flexion  Strengthening;Both;20 reps    Flexion Weight (lbs)  1    Abduction  Strengthening;Both;20 reps    ABduction Weight (lbs)  1    Diagonals  Strengthening;Both;20 reps;Weights    Diagonals Weight (lbs)  1      Shoulder Exercises: Standing   Horizontal ABduction  Strengthening;Both;20 reps;Theraband    Theraband Level (Shoulder Horizontal ABduction)  Level 1 (Yellow)    Extension  Strengthening;Both;20 reps    Theraband Level (Shoulder Extension)  Level 2 (Red)    Row  Strengthening;Both;20 reps      Shoulder Exercises:  ROM/Strengthening   UBE (Upper Arm Bike)  Level 1x 6 minutes (3/3)      Manual Therapy   Manual Therapy  Soft tissue mobilization;Myofascial release    Manual therapy comments  soft tissue elongation to bil upper traps and Rt posterior shoulder musculature.       Trigger Point Dry Needling - 03/26/18 1026    Consent Given?  Yes    Education Handout Provided  Yes    Muscles Treated Upper Body  Upper trapezius;Infraspinatus    Upper Trapezius Response  Twitch reponse elicited;Palpable increased muscle length    Infraspinatus Response  Twitch response elicited;Palpable increased muscle length   Rt only          PT Education - 03/26/18 1037    Education Details   Access Code: [email or web address] , dry needling info    Person(s) Educated  Patient    Methods  Explanation;Demonstration    Comprehension  Verbalized understanding;Returned demonstration       PT Short Term Goals - 03/19/18 1200      PT SHORT TERM GOAL #1   Title  be independent in initial HEP    Time  4    Period  Weeks    Status  New    Target Date  04/16/18      PT SHORT TERM GOAL #2   Title  report a 25% reduction in Rt arm soreness after swimming    Time  4    Period  Weeks    Status  New    Target Date  04/16/18        PT Long Term Goals - 03/19/18 1016      PT LONG TERM GOAL #1   Title  be independent in advanced HEP    Time  8    Period  Weeks    Status  New    Target Date  05/14/18      PT LONG TERM GOAL #2   Title  reduce FOTO to < or = to 24% limitation    Time  8    Period  Weeks    Status  New    Target Date  05/14/18      PT LONG TERM GOAL #3   Title  report a 60% reduction in Rt arm soreness after swimming    Time  8    Period  Weeks    Status  New    Target Date  05/14/18      PT LONG TERM GOAL #4   Title  demonstate neutral seated posture and report postural corrections at work    Time  8    Period  Weeks    Status  New    Target Date  05/14/18      PT LONG TERM GOAL #5    Title  demonstrate 4+/5 Rt shoulder strength throughout to improve endurance with swimming    Time  8    Period  Weeks    Status  New    Target Date  05/14/18            Plan - 03/26/18 1034    Clinical Impression Statement  Pt with first time follow-up after evaluation.  Pt is working on making postural corrections at home and demonstrates improved postural position with exercise today.  Pt with fatigue with arm bike today.  Pt with trigger points and tension in bil upper traps and posterior Rt glenohumeral joint and demonstrated improved tissue mobility after dry needling today.  Pt will continue to benefit from skilled PT for postural strength and manual for tissue mobility to reduce pain.      PT Frequency  2x / week    PT Duration  8 weeks    PT Treatment/Interventions  ADLs/Self Care Home Management;Cryotherapy;Electrical Stimulation;Moist Heat;Therapeutic activities;Therapeutic exercise;Patient/family education;Neuromuscular re-education;Manual techniques;Passive range of motion;Taping;Dry needling    PT Next Visit Plan  assess response to dry needling to Rt shoulder and upper trap, RTC strength, review HEP, stretch over foam roll    PT Home Exercise Plan  Access Code: [email or web address]    Consulted and Agree with Plan of Care  Patient       Patient will benefit from skilled therapeutic intervention in order to improve the following deficits and impairments:  Pain, Decreased activity tolerance, Decreased strength, Postural dysfunction, Increased muscle spasms, Improper body mechanics  Visit Diagnosis: Chronic right shoulder pain  Muscle weakness (generalized)  Cramp and spasm  Abnormal posture     Problem List Patient Active Problem List   Diagnosis Date Noted  . Routine general medical examination at a health care facility 07/03/2014  . Insomnia 07/03/2014    Sigurd Sos, PT 03/26/18 11:02 AM  [facility] [address] [place], [place], [address] Phone: [phone]   Fax:  [phone]  Name: Shirley Tucker MRN: [id number] Date of Birth: April 03, 1971

## 2018-03-26 NOTE — Patient Instructions (Addendum)
Trigger Point Dry Needling  . What is Trigger Point Dry Needling (DN)? o DN is a physical therapy technique used to treat muscle pain and dysfunction. Specifically, DN helps deactivate muscle trigger points (muscle knots).  o A thin filiform needle is used to penetrate the skin and stimulate the underlying trigger point. The goal is for a local twitch response (LTR) to occur and for the trigger point to relax. No medication of any kind is injected during the procedure.   . What Does Trigger Point Dry Needling Feel Like?  o The procedure feels different for each individual patient. Some patients report that they do not actually feel the needle enter the skin and overall the process is not painful. Very mild bleeding may occur. However, many patients feel a deep cramping in the muscle in which the needle was inserted. This is the local twitch response.   Marland Kitchen How Will I feel after the treatment? o Soreness is normal, and the onset of soreness may not occur for a few hours. Typically this soreness does not last longer than two days.  o Bruising is uncommon, however; ice can be used to decrease any possible bruising.  o In rare cases feeling tired or nauseous after the treatment is normal. In addition, your symptoms may get worse before they get better, this period will typically not last longer than 24 hours.   . What Can I do After My Treatment? o Increase your hydration by drinking more water for the next 24 hours. o You may place ice or heat on the areas treated that have become sore, however, do not use heat on inflamed or bruised areas. Heat often brings more relief post needling. o You can continue your regular activities, but vigorous activity is not recommended initially after the treatment for 24 hours. o DN is best combined with other physical therapy such as strengthening, stretching, and other therapies.   Access Code: [email or web address]  URL: [email or web address]  Date: 03/26/2018   Prepared by: Sigurd Sos   Exercises  Shoulder External Rotation and Scapular Retraction with Resistance - 10 reps - 2 sets - 2x daily - 7x weekly  Scapular Retraction with Resistance - 10 reps - 3 sets - 2x daily - 7x weekly  Scapular Retraction with Resistance Advanced - 10 reps - 3 sets - 2x daily - 7x weekly  Doorway Pec Stretch at 90 Degrees Abduction - 5 reps - 15 hold - 3x daily - 7x weekly  Seated Correct Posture - 10 reps - 1x daily - 7x weekly  Seated Shoulder Abduction - Palms Down - 10 reps - 2 sets - 2x daily - 7x weekly  Seated Shoulder Flexion - 10 reps - 2 sets - 2x daily - 7x weekly  Seated Shoulder Scaption - 10 reps - 2 sets - 2x daily - 7x weekly   [facility] [address] [place], [place] [address] Phone # [phone] Fax [phone]

## 2018-04-02 ENCOUNTER — Ambulatory Visit: Payer: 59

## 2018-04-02 DIAGNOSIS — M25511 Pain in right shoulder: Principal | ICD-10-CM

## 2018-04-02 DIAGNOSIS — R293 Abnormal posture: Secondary | ICD-10-CM | POA: Diagnosis not present

## 2018-04-02 DIAGNOSIS — R252 Cramp and spasm: Secondary | ICD-10-CM | POA: Diagnosis not present

## 2018-04-02 DIAGNOSIS — M6281 Muscle weakness (generalized): Secondary | ICD-10-CM | POA: Diagnosis not present

## 2018-04-02 DIAGNOSIS — G8929 Other chronic pain: Secondary | ICD-10-CM | POA: Diagnosis not present

## 2018-04-02 NOTE — Therapy (Signed)
[facility] [address] [place], [place], [address] Phone: [phone]   Fax:  [phone]  Physical Therapy Treatment  Patient Details  Name: Shirley Tucker MRN: [id number] Date of Birth: [birth year] Referring Provider (PT): Pricilla Holm, MD   Encounter Date: 04/02/2018  PT End of Session - 04/02/18 0929    Visit Number  3    Date for PT Re-Evaluation  05/14/18    Authorization Type  UMR    PT Start Time  0846    PT Stop Time  0929    PT Time Calculation (min)  43 min    Activity Tolerance  Patient tolerated treatment well    Behavior During Therapy  [facility] for tasks assessed/performed       History reviewed. No pertinent past medical history.  Past Surgical History:  Procedure Laterality Date  . KNEE SURGERY Bilateral 1992    There were no vitals filed for this visit.  Subjective Assessment - 04/02/18 0848    Subjective  I am doing my exercises.  I am still sore but I feel overall better.  30% overall improvement since the start of care.      Patient Stated Goals  reduce Rt arm pain    Currently in Pain?  No/denies                       [facility] Adult PT Treatment/Exercise - 04/02/18 0001      Exercises   Exercises  Shoulder      Shoulder Exercises: Supine   Horizontal ABduction  Strengthening;Both;20 reps;Theraband    Theraband Level (Shoulder Horizontal ABduction)  Level 1 (Yellow)    Horizontal ABduction Limitations  on foam roll    Other Supine Exercises  supine on foam roll: pec stretch, snow angels x 10      Shoulder Exercises: Seated   Elevation  Strengthening;Both    Elevation Weight (lbs)  3    Elevation Limitations  1x7    External Rotation  Strengthening;Both;20 reps    Theraband Level (Shoulder External Rotation)  Level 1 (Yellow)    Flexion  Strengthening;Both;Weights    Flexion Weight (lbs)  3    Flexion Limitations  1x7    Abduction  Strengthening;Both;Weights     ABduction Weight (lbs)  3    ABduction Limitations  1x7    Diagonals  Strengthening;Both;Weights    Diagonals Weight (lbs)  1    Diagonals Limitations  1x7      Shoulder Exercises: Standing   Horizontal ABduction  Strengthening;Both;20 reps;Theraband    Theraband Level (Shoulder Horizontal ABduction)  Level 1 (Yellow)      Shoulder Exercises: ROM/Strengthening   UBE (Upper Arm Bike)  Level 1x 6 minutes (3/3)   PT present to discuss progress     Manual Therapy   Manual Therapy  Soft tissue mobilization;Myofascial release    Manual therapy comments  soft tissue elongation to bil upper traps and Rt posterior shoulder musculature.       Trigger Point Dry Needling - 04/02/18 6962    Consent Given?  Yes    Muscles Treated Upper Body  Upper trapezius;Infraspinatus   Rt and Lt upper traps, Rt infraspinatus   Upper Trapezius Response  Twitch reponse elicited;Palpable increased muscle length    Infraspinatus Response  Twitch response elicited;Palpable increased muscle length             PT Short Term Goals - 04/02/18 9528  PT SHORT TERM GOAL #1   Title  be independent in initial HEP    Status  Achieved      PT SHORT TERM GOAL #2   Title  report a 25% reduction in Rt arm soreness after swimming    Baseline  30% reduction    Status  Achieved        PT Long Term Goals - 03/19/18 1016      PT LONG TERM GOAL #1   Title  be independent in advanced HEP    Time  8    Period  Weeks    Status  New    Target Date  05/14/18      PT LONG TERM GOAL #2   Title  reduce FOTO to < or = to 24% limitation    Time  8    Period  Weeks    Status  New    Target Date  05/14/18      PT LONG TERM GOAL #3   Title  report a 60% reduction in Rt arm soreness after swimming    Time  8    Period  Weeks    Status  New    Target Date  05/14/18      PT LONG TERM GOAL #4   Title  demonstate neutral seated posture and report postural corrections at work    Time  8    Period  Weeks     Status  New    Target Date  05/14/18      PT LONG TERM GOAL #5   Title  demonstrate 4+/5 Rt shoulder strength throughout to improve endurance with swimming    Time  8    Period  Weeks    Status  New    Target Date  05/14/18            Plan - 04/02/18 0849    Clinical Impression Statement  Pt reports 30% reduction in Rt shoulder pain overall.  She is consistent with making postural corrections at home and work and is doing her HEP for flexibility and strength.  Pt with increased speed and reduced fatigue on the arm bike today.  Pt continues to require verbal and tactile cues for postural alignment with exercise today.  Pt with trigger points in bil upper traps and Rt posterior shoulder musculature.  Pt will continue to benefit from skilled PT to address posture and Rt shoulder strength, flexibility and trigger points.     PT Frequency  2x / week    PT Duration  8 weeks    PT Treatment/Interventions  ADLs/Self Care Home Management;Cryotherapy;Electrical Stimulation;Moist Heat;Therapeutic activities;Therapeutic exercise;Patient/family education;Neuromuscular re-education;Manual techniques;Passive range of motion;Taping;Dry needling    PT Next Visit Plan  assess response to dry needling to Rt shoulder and upper trap, RTC strength,  stretch over foam roll    PT Home Exercise Plan  Access Code: [email or web address]    Consulted and Agree with Plan of Care  Patient       Patient will benefit from skilled therapeutic intervention in order to improve the following deficits and impairments:  Pain, Decreased activity tolerance, Decreased strength, Postural dysfunction, Increased muscle spasms, Improper body mechanics  Visit Diagnosis: Chronic right shoulder pain  Muscle weakness (generalized)  Cramp and spasm  Abnormal posture     Problem List Patient Active Problem List   Diagnosis Date Noted  . Routine general medical examination at a health care facility 07/03/2014  . Insomnia 07/03/2014  Sigurd Sos, PT 04/02/18 9:30 AM  [facility] [address] [place], [place], [address] Phone: [phone]   Fax:  [phone]  Name: Shirley Tucker MRN: [id number] Date of Birth: 02-Apr-1971

## 2018-04-16 ENCOUNTER — Ambulatory Visit (HOSPITAL_COMMUNITY): Payer: 59

## 2018-04-16 ENCOUNTER — Ambulatory Visit: Payer: 59 | Attending: Internal Medicine

## 2018-04-16 ENCOUNTER — Other Ambulatory Visit: Payer: Self-pay

## 2018-04-16 ENCOUNTER — Ambulatory Visit (HOSPITAL_COMMUNITY): Payer: 59 | Attending: Cardiology

## 2018-04-16 DIAGNOSIS — M25511 Pain in right shoulder: Secondary | ICD-10-CM | POA: Diagnosis not present

## 2018-04-16 DIAGNOSIS — M6281 Muscle weakness (generalized): Secondary | ICD-10-CM | POA: Diagnosis not present

## 2018-04-16 DIAGNOSIS — R252 Cramp and spasm: Secondary | ICD-10-CM | POA: Diagnosis not present

## 2018-04-16 DIAGNOSIS — G8929 Other chronic pain: Secondary | ICD-10-CM | POA: Diagnosis not present

## 2018-04-16 DIAGNOSIS — R293 Abnormal posture: Secondary | ICD-10-CM | POA: Diagnosis not present

## 2018-04-16 DIAGNOSIS — R079 Chest pain, unspecified: Secondary | ICD-10-CM | POA: Insufficient documentation

## 2018-04-16 NOTE — Therapy (Signed)
[facility] [address] [place], [place], [address] Phone: [phone]   Fax:  [phone]  Physical Therapy Treatment  Patient Details  Name: Shirley Tucker MRN: [id number] Date of Birth: 31-Jan-1971 Referring Provider (PT): Pricilla Holm, MD   Encounter Date: 04/16/2018  PT End of Session - 04/16/18 0929    Visit Number  4    Date for PT Re-Evaluation  05/14/18    Authorization Type  UMR    PT Start Time  0846    PT Stop Time  0928    PT Time Calculation (min)  42 min    Activity Tolerance  Patient tolerated treatment well    Behavior During Therapy  [facility] for tasks assessed/performed       History reviewed. No pertinent past medical history.  Past Surgical History:  Procedure Laterality Date  . KNEE SURGERY Bilateral 1992    There were no vitals filed for this visit.  Subjective Assessment - 04/16/18 0851    Subjective  I am doing well.  Doing my exercises 5x/wk.  50-60% overall improvement since the start of care.      Currently in Pain?  No/denies                       [facility] Adult PT Treatment/Exercise - 04/16/18 0001      Exercises   Exercises  Shoulder      Shoulder Exercises: Supine   Horizontal ABduction  Strengthening;Both;20 reps;Theraband    Theraband Level (Shoulder Horizontal ABduction)  Level 2 (Red)    Horizontal ABduction Limitations  on foam roll    External Rotation  Strengthening;Both;20 reps;Theraband    Theraband Level (Shoulder External Rotation)  Level 2 (Red)    External Rotation Limitations  on foam roll    Other Supine Exercises  supine on foam roll: pec stretch, snow angels x 10      Shoulder Exercises: Seated   Elevation  Strengthening;Both    Elevation Weight (lbs)  3    Elevation Limitations  1x7    Flexion  Strengthening;Both;Weights    Flexion Weight (lbs)  3    Flexion Limitations  1x7    Abduction  Strengthening;Both;Weights    ABduction Weight  (lbs)  3    ABduction Limitations  1x7    Diagonals  Strengthening;Both;Weights    Diagonals Weight (lbs)  1    Diagonals Limitations  1x7      Shoulder Exercises: Sidelying   Other Sidelying Exercises  open book stretch x10 each       Shoulder Exercises: ROM/Strengthening   UBE (Upper Arm Bike)  Level 1x 6 minutes (3/3)   PT present to discuss progress     Manual Therapy   Manual Therapy  Soft tissue mobilization;Myofascial release    Manual therapy comments  soft tissue elongation to bil upper traps and Rt posterior shoulder musculature.       Trigger Point Dry Needling - 04/16/18 0932    Consent Given?  Yes    Muscles Treated Upper Body  Upper trapezius;Infraspinatus   Rt and Lt upper traps   Upper Trapezius Response  Twitch reponse elicited;Palpable increased muscle length    Infraspinatus Response  Twitch response elicited;Palpable increased muscle length   Rt only            PT Short Term Goals - 04/02/18 0849      PT SHORT TERM GOAL #1   Title  be  independent in initial HEP    Status  Achieved      PT SHORT TERM GOAL #2   Title  report a 25% reduction in Rt arm soreness after swimming    Baseline  30% reduction    Status  Achieved        PT Long Term Goals - 04/16/18 0853      PT LONG TERM GOAL #3   Title  report a 60% reduction in Rt arm soreness after swimming    Baseline  50%     Time  8    Period  Weeks    Status  On-going      PT LONG TERM GOAL #4   Title  demonstate neutral seated posture and report postural corrections at work    Baseline  working on postural corrections    Time  8    Period  Weeks    Status  On-going            Plan - 04/16/18 9528    Clinical Impression Statement  Pt reports 50-60% overall improvement in symptoms since the start of care.  Pt has been doing exercises consistently and is making postural corrections at home and work.  Pt remains weak in her postural stabilizers and core and PT is working to improve  this with stability exercise.  Pt was able to advance to red theraband with supine exercise and performed shoulder raises on ball to improve postural stability.  Pt with trigger points in Rt upper traps and posterior shoulder musculature and demonstrated improved mobility and reduced soreness after manual therapy and needling today.  Pt will continue to benefit from skilled PT for postural strength, manual and flexibility.      Rehab Potential  Good    PT Frequency  2x / week    PT Duration  8 weeks    PT Treatment/Interventions  ADLs/Self Care Home Management;Cryotherapy;Electrical Stimulation;Moist Heat;Therapeutic activities;Therapeutic exercise;Patient/family education;Neuromuscular re-education;Manual techniques;Passive range of motion;Taping;Dry needling    PT Next Visit Plan  assess response to dry needling to Rt shoulder and upper trap, RTC strength,  continue postural strength. Plan D/C to HEP    PT Home Exercise Plan  Access Code: [facility]    Recommended Other Services  initial certification is signed    Consulted and Agree with Plan of Care  Patient       Patient will benefit from skilled therapeutic intervention in order to improve the following deficits and impairments:  Pain, Decreased activity tolerance, Decreased strength, Postural dysfunction, Increased muscle spasms, Improper body mechanics  Visit Diagnosis: Chronic right shoulder pain  Muscle weakness (generalized)  Abnormal posture  Cramp and spasm     Problem List Patient Active Problem List   Diagnosis Date Noted  . Routine general medical examination at a health care facility 07/03/2014  . Insomnia 07/03/2014    Sigurd Sos, PT 04/16/18 9:33 AM  [facility] [address] [place], [place], [address] Phone: [phone]   Fax:  [phone]  Name: Shirley Tucker MRN: [id number] Date of Birth: 10-Jul-1970

## 2018-04-30 ENCOUNTER — Ambulatory Visit: Payer: 59

## 2018-04-30 DIAGNOSIS — G8929 Other chronic pain: Secondary | ICD-10-CM | POA: Diagnosis not present

## 2018-04-30 DIAGNOSIS — R252 Cramp and spasm: Secondary | ICD-10-CM | POA: Diagnosis not present

## 2018-04-30 DIAGNOSIS — M6281 Muscle weakness (generalized): Secondary | ICD-10-CM | POA: Diagnosis not present

## 2018-04-30 DIAGNOSIS — M25511 Pain in right shoulder: Secondary | ICD-10-CM | POA: Diagnosis not present

## 2018-04-30 DIAGNOSIS — R293 Abnormal posture: Secondary | ICD-10-CM | POA: Diagnosis not present

## 2018-04-30 NOTE — Therapy (Signed)
[facility] [address] [place], [place], [address] Phone: [phone]   Fax:  [phone]  Physical Therapy Treatment  Patient Details  Name: Shirley Tucker MRN: [id number] Date of Birth: 12/24/1970 Referring Provider (PT): Pricilla Holm, MD   Encounter Date: 04/30/2018  PT End of Session - 04/30/18 0844    Visit Number  5    Date for PT Re-Evaluation  05/14/18    Authorization Type  UMR    PT Start Time  0810   late   PT Stop Time  0845    PT Time Calculation (min)  35 min    Activity Tolerance  Patient tolerated treatment well    Behavior During Therapy  [facility] for tasks assessed/performed       History reviewed. No pertinent past medical history.  Past Surgical History:  Procedure Laterality Date  . KNEE SURGERY Bilateral 1992    There were no vitals filed for this visit.  Subjective Assessment - 04/30/18 0813    Subjective  I feel 80% improvement in my Rt arm.  I dont feel limited in using my Rt UE.      Currently in Pain?  No/denies   up to 2-3/10        [facility] PT Assessment - 04/30/18 0001      Assessment   Medical Diagnosis  acute Rt shoulder     Onset Date/Surgical Date  07/17/17      Observation/Other Assessments   Focus on Therapeutic Outcomes (FOTO)   30% limitation      Posture/Postural Control   Posture/Postural Control  No significant limitations    Posture Comments  pt is working to make postural corrections       Strength   Right Shoulder Flexion  4+/5    Right Shoulder Extension  4+/5    Right Shoulder ABduction  4/5    Right Shoulder Internal Rotation  5/5    Right Shoulder External Rotation  4+/5                   [facility] Adult PT Treatment/Exercise - 04/30/18 0001      Shoulder Exercises: Supine   Horizontal ABduction  Strengthening;Both;20 reps;Theraband    Theraband Level (Shoulder Horizontal ABduction)  Level 3 (Green)    Horizontal ABduction Limitations   on foam roll    External Rotation  Strengthening;Both;20 reps;Theraband    Theraband Level (Shoulder External Rotation)  Level 3 (Green)    External Rotation Limitations  on foam roll      Shoulder Exercises: ROM/Strengthening   UBE (Upper Arm Bike)  Level 1x 6 minutes (3/3)   PT present to discuss progress     Manual Therapy   Manual Therapy  Soft tissue mobilization;Myofascial release    Manual therapy comments  soft tissue elongation to bil upper traps and Rt posterior shoulder musculature.       Trigger Point Dry Needling - 04/30/18 0827    Consent Given?  Yes    Muscles Treated Upper Body  Upper trapezius;Infraspinatus   Rt   Upper Trapezius Response  Palpable increased muscle length;Twitch reponse elicited    Infraspinatus Response  Twitch response elicited;Palpable increased muscle length             PT Short Term Goals - 04/02/18 0849      PT SHORT TERM GOAL #1   Title  be independent in initial HEP    Status  Achieved  PT SHORT TERM GOAL #2   Title  report a 25% reduction in Rt arm soreness after swimming    Baseline  30% reduction    Status  Achieved        PT Long Term Goals - 04/30/18 0814      PT LONG TERM GOAL #1   Title  be independent in advanced HEP    Status  Achieved      PT LONG TERM GOAL #2   Title  reduce FOTO to < or = to 24% limitation    Baseline  30%    Status  Not Met      PT LONG TERM GOAL #3   Title  report a 60% reduction in Rt arm soreness after swimming    Baseline  80%    Status  Achieved      PT LONG TERM GOAL #4   Title  demonstate neutral seated posture and report postural corrections at work    Status  Achieved      PT LONG TERM GOAL #5   Title  demonstrate 4+/5 Rt shoulder strength throughout to improve endurance with swimming    Baseline  4 to 4+/5     Status  Partially Met              Patient will benefit from skilled therapeutic intervention in order to improve the following deficits and  impairments:     Visit Diagnosis: Chronic right shoulder pain  Muscle weakness (generalized)  Abnormal posture  Cramp and spasm     Problem List Patient Active Problem List   Diagnosis Date Noted  . Routine general medical examination at a health care facility 07/03/2014  . Insomnia 07/03/2014  PHYSICAL THERAPY DISCHARGE SUMMARY  Visits from Start of Care: 6   Current functional level related to goals / functional outcomes: See above for current status.     Remaining deficits: Mild Rt shoulder pain that is intermittent and doesn't limit activity.     Education / Equipment: HEP, posture Plan: Patient agrees to discharge.  Patient goals were partially met. Patient is being discharged due to being pleased with the current functional level.  ?????        Sigurd Sos, PT 04/30/18 8:46 AM  [facility] [address] [place], [place], [address] Phone: [phone]   Fax:  [phone]  Name: Kenyona Rena MRN: [id number] Date of Birth: 02-05-1971

## 2018-05-09 ENCOUNTER — Other Ambulatory Visit: Payer: Self-pay | Admitting: Internal Medicine

## 2018-05-09 NOTE — Telephone Encounter (Signed)
Copied from [place] [id number]. Topic: Quick Communication - Rx Refill/Question >> May 09, 2018  4:45 PM Sheppard Coil, [employer] wrote: Medication: zolpidem (AMBIEN) 10 MG tablet  Anything for sea sickness  Has the patient contacted their pharmacy? Yes - they state RX is expired.  Pt states she only uses it when she travels and she is getting ready to go on a cruise and feels she needs the Ambien.  Pt also would like to have something for sea sickness to have on hand if possible. (Agent: If no, request that the patient contact the pharmacy for the refill.) (Agent: If yes, when and what did the pharmacy advise?)  Preferred Pharmacy (with phone number or street name): [place], [place] - [place] [phone] (Phone) [phone] (Fax)  Agent: Please be advised that RX refills may take up to 3 business days. We ask that you follow-up with your pharmacy.

## 2018-05-10 MED ORDER — ZOLPIDEM TARTRATE 10 MG PO TABS: 10.0000 mg | ORAL_TABLET | Freq: Every evening | ORAL | 1 refills | Status: DC | PRN

## 2018-05-10 MED ORDER — SCOPOLAMINE 1 MG/3DAYS TD PT72: 1.0000 | MEDICATED_PATCH | TRANSDERMAL | 0 refills | Status: DC

## 2018-05-10 NOTE — Telephone Encounter (Signed)
No records pulls up on [place] for this patient. Please advise.

## 2018-05-10 NOTE — Telephone Encounter (Signed)
Sent in. The patch last 3 days and you place behind the ear. Switch ears with each patch. The main side effect is dry mouth which generally is mild. It can be bad enough that people remove the patch. Make sure to wash hands well after applying or removing patch as the medicine can be harmful if it accidentally gets into the eye.

## 2018-05-10 NOTE — Telephone Encounter (Signed)
Its four days she leaves next Tuesday

## 2018-05-10 NOTE — Telephone Encounter (Signed)
How long is cruise length?

## 2018-05-10 NOTE — Addendum Note (Signed)
Addended by: Pricilla Holm A on: 05/10/2018 01:41 PM   Modules accepted: Orders

## 2018-07-29 ENCOUNTER — Telehealth: Payer: 59 | Admitting: Physician Assistant

## 2018-07-29 DIAGNOSIS — J029 Acute pharyngitis, unspecified: Secondary | ICD-10-CM

## 2018-07-29 NOTE — Progress Notes (Signed)
Based on what you shared with me, I feel your condition warrants further evaluation and I recommend that you be seen for a face to face office visit.  Ms. Berkheimer,  Sorry your daughter is not doing well! We are unable to do Evisits for patients under 49 years of age, so she would need to follow up with her Pediatrician. If she is over 65 years of age, she must complete the Evisit herself. Hope she feels better soon!      NOTE: If you entered your credit card information for this eVisit, you will not be charged. You may see a "hold" on your card for the $35 but that hold will drop off and you will not have a charge processed.  If you are having a true medical emergency please call 911.  If you need an urgent face to face visit, [place] has four urgent care centers for your convenience.  If you need care fast and have a high deductible or no insurance consider:   [email or web address] to reserve your spot online an avoid wait times  [facility] [address] [place], [place] [address] 8 am to 8 pm Monday-Friday 10 am to 4 pm Saturday-Sunday *Across the street from [employer]  [place], [address] 8 am to 5 pm Monday-Friday * In the [facility] on the [facility]   The following sites will take your insurance:  . [facility] [place] a Provider at this Location  [address] [place], [place] [address] . 10 am to 8 pm Monday-Friday . 12 pm to 8 pm Saturday-Sunday   . [facility] at [place] a Provider at this Location  [address] [place], [place], [place] [address] . 8 am to 8 pm Monday-Friday . 9 am to 6 pm Saturday . 11 am to 6 pm Sunday   . [facility] at [place] Get Driving Directions  [address] [place], [place] [address] . 8 am to 8 pm Monday-Friday . 8 am to 4 pm Saturday-Sunday   Your e-visit answers were reviewed by a board certified advanced clinical practitioner to complete your personal care plan.  Thank you for using e-Visits.  I have spent 7 min to complete this note- [facility]

## 2018-08-28 ENCOUNTER — Telehealth (HOSPITAL_COMMUNITY): Payer: Self-pay

## 2018-08-28 NOTE — Telephone Encounter (Signed)
Medical records request faxed to [facility] attn Celedonio Savage, 19 pages from one/only pt visit 01/23/17

## 2018-09-11 ENCOUNTER — Telehealth (HOSPITAL_COMMUNITY): Payer: Self-pay

## 2018-09-11 NOTE — Telephone Encounter (Signed)
Echo results faxed to [facility] per request for records

## 2018-10-03 ENCOUNTER — Telehealth: Payer: Self-pay

## 2018-10-03 DIAGNOSIS — Z1239 Encounter for other screening for malignant neoplasm of breast: Secondary | ICD-10-CM

## 2018-10-03 NOTE — Addendum Note (Signed)
Addended by: Pricilla Holm A on: 10/03/2018 11:13 AM   Modules accepted: Orders

## 2018-10-03 NOTE — Telephone Encounter (Signed)
Patient rescheduled her physical for July but would like to know if she can get a referral in for her mammogram screening so she can get that scheduled as well

## 2018-10-03 NOTE — Telephone Encounter (Signed)
Ordered

## 2018-10-03 NOTE — Telephone Encounter (Signed)
Noted  

## 2018-10-08 ENCOUNTER — Encounter: Payer: 59 | Admitting: Internal Medicine

## 2018-10-29 ENCOUNTER — Other Ambulatory Visit: Payer: Self-pay

## 2018-10-29 ENCOUNTER — Ambulatory Visit
Admission: RE | Admit: 2018-10-29 | Discharge: 2018-10-29 | Disposition: A | Discharge status: Home / Self Care | Payer: 59 | Source: Ambulatory Visit | Attending: Internal Medicine | Admitting: Internal Medicine

## 2018-10-29 DIAGNOSIS — Z1239 Encounter for other screening for malignant neoplasm of breast: Secondary | ICD-10-CM

## 2018-10-29 DIAGNOSIS — Z1231 Encounter for screening mammogram for malignant neoplasm of breast: Secondary | ICD-10-CM | POA: Diagnosis not present

## 2018-11-26 DIAGNOSIS — Z20828 Contact with and (suspected) exposure to other viral communicable diseases: Secondary | ICD-10-CM | POA: Diagnosis not present

## 2018-12-03 ENCOUNTER — Encounter: Payer: 59 | Admitting: Internal Medicine

## 2018-12-10 ENCOUNTER — Other Ambulatory Visit (INDEPENDENT_AMBULATORY_CARE_PROVIDER_SITE_OTHER): Payer: 59

## 2018-12-10 ENCOUNTER — Ambulatory Visit (INDEPENDENT_AMBULATORY_CARE_PROVIDER_SITE_OTHER): Payer: 59 | Admitting: Internal Medicine

## 2018-12-10 ENCOUNTER — Encounter: Payer: Self-pay | Admitting: Internal Medicine

## 2018-12-10 ENCOUNTER — Other Ambulatory Visit: Payer: Self-pay

## 2018-12-10 VITALS — BP 118/82 | HR 75 | Temp 98.5°F | Ht 64.0 in | Wt 128.0 lb

## 2018-12-10 DIAGNOSIS — Z0000 Encounter for general adult medical examination without abnormal findings: Secondary | ICD-10-CM

## 2018-12-10 DIAGNOSIS — F5101 Primary insomnia: Secondary | ICD-10-CM

## 2018-12-10 DIAGNOSIS — R7301 Impaired fasting glucose: Secondary | ICD-10-CM | POA: Diagnosis not present

## 2018-12-10 LAB — COMPREHENSIVE METABOLIC PANEL
ALT: 10 U/L (ref 0–35)
AST: 14 U/L (ref 0–37)
Albumin: 4.2 g/dL (ref 3.5–5.2)
Alkaline Phosphatase: 36 U/L — ABNORMAL LOW (ref 39–117)
BUN: 11 mg/dL (ref 6–23)
CO2: 30 mEq/L (ref 19–32)
Calcium: 9 mg/dL (ref 8.4–10.5)
Chloride: 105 mEq/L (ref 96–112)
Creatinine, Ser: 0.67 mg/dL (ref 0.40–1.20)
GFR: 93.39 mL/min (ref >60.00)
Glucose, Bld: 102 mg/dL — ABNORMAL HIGH (ref 70–99)
Potassium: 4.2 mEq/L (ref 3.5–5.1)
Sodium: 140 mEq/L (ref 135–145)
Total Bilirubin: 0.4 mg/dL (ref 0.2–1.2)
Total Protein: 7.1 g/dL (ref 6.0–8.3)

## 2018-12-10 LAB — TSH: TSH: 1.47 u[IU]/mL (ref 0.35–4.50)

## 2018-12-10 LAB — LIPID PANEL
Cholesterol: 154 mg/dL (ref 0–200)
HDL: 56.1 mg/dL (ref >39.00)
LDL Cholesterol: 89 mg/dL (ref 0–99)
NonHDL: 98.06
Total CHOL/HDL Ratio: 3
Triglycerides: 44 mg/dL (ref 0.0–149.0)
VLDL: 8.8 mg/dL (ref 0.0–40.0)

## 2018-12-10 LAB — HEMOGLOBIN A1C: Hgb A1c MFr Bld: 6 % (ref 4.6–6.5)

## 2018-12-10 LAB — CBC
HCT: 34.1 % — ABNORMAL LOW (ref 36.0–46.0)
Hemoglobin: 10.8 g/dL — ABNORMAL LOW (ref 12.0–15.0)
MCHC: 31.8 g/dL (ref 30.0–36.0)
MCV: 85 fl (ref 78.0–100.0)
Platelets: 241 10*3/uL (ref 150.0–400.0)
RBC: 4.01 Mil/uL (ref 3.87–5.11)
RDW: 17.4 % — ABNORMAL HIGH (ref 11.5–15.5)
WBC: 3 10*3/uL — ABNORMAL LOW (ref 4.0–10.5)

## 2018-12-10 LAB — VITAMIN D 25 HYDROXY (VIT D DEFICIENCY, FRACTURES): VITD: 21.06 ng/mL — ABNORMAL LOW (ref 30.00–100.00)

## 2018-12-10 LAB — FERRITIN: Ferritin: 6.4 ng/mL — ABNORMAL LOW (ref 10.0–291.0)

## 2018-12-10 NOTE — Progress Notes (Signed)
   Subjective:   Patient ID: Shirley Tucker, female    DOB: 09-Sep-1970, 48 y.o.   MRN: [id number]  HPI The patient is a 48 YO female coming in for physical.   PMH, [facility], social history reviewed and updated.   Review of Systems  Constitutional: Negative.   HENT: Negative.   Eyes: Negative.   Respiratory: Negative for cough, chest tightness and shortness of breath.   Cardiovascular: Negative for chest pain, palpitations and leg swelling.  Gastrointestinal: Negative for abdominal distention, abdominal pain, constipation, diarrhea, nausea and vomiting.  Musculoskeletal: Negative.   Skin: Negative.   Neurological: Negative.   Psychiatric/Behavioral: Negative.     Objective:  Physical Exam Constitutional:      Appearance: She is well-developed.  HENT:     Head: Normocephalic and atraumatic.  Neck:     Musculoskeletal: Normal range of motion.  Cardiovascular:     Rate and Rhythm: Normal rate and regular rhythm.  Pulmonary:     Effort: Pulmonary effort is normal. No respiratory distress.     Breath sounds: Normal breath sounds. No wheezing or rales.  Abdominal:     General: Bowel sounds are normal. There is no distension.     Palpations: Abdomen is soft.     Tenderness: There is no abdominal tenderness. There is no rebound.  Skin:    General: Skin is warm and dry.  Neurological:     Mental Status: She is alert and oriented to person, place, and time.     Coordination: Coordination normal.     Vitals:   12/10/18 0900  BP: 118/82  Pulse: 75  Temp: 98.5 F (36.9 C)  TempSrc: Oral  SpO2: 99%  Weight: 128 lb (58.1 kg)  Height: 5\' 4"  (1.626 m)    Assessment & Plan:

## 2018-12-10 NOTE — Assessment & Plan Note (Signed)
Checking HgA1c, she would like to consider metformin if HgA1c >6.0. She is monitoring sugars intermittently and tries to exercise. Has gained some weight in the last year.

## 2018-12-10 NOTE — Assessment & Plan Note (Signed)
Uses ambien and understands the risk and benefit and would like to continue given increase in QOL.

## 2018-12-10 NOTE — Addendum Note (Signed)
Addended by: Hoyt Koch on: 12/10/2018 09:37 AM   Modules accepted: Orders

## 2018-12-10 NOTE — Patient Instructions (Signed)
Health Maintenance, Female Adopting a healthy lifestyle and getting preventive care are important in promoting health and wellness. Ask your health care provider about:  The right schedule for you to have regular tests and exams.  Things you can do on your own to prevent diseases and keep yourself healthy. What should I know about diet, weight, and exercise? Eat a healthy diet   Eat a diet that includes plenty of vegetables, fruits, low-fat dairy products, and lean protein.  Do not eat a lot of foods that are high in solid fats, added sugars, or sodium. Maintain a healthy weight Body mass index (BMI) is used to identify weight problems. It estimates body fat based on height and weight. Your health care provider can help determine your BMI and help you achieve or maintain a healthy weight. Get regular exercise Get regular exercise. This is one of the most important things you can do for your health. Most adults should:  Exercise for at least 150 minutes each week. The exercise should increase your heart rate and make you sweat (moderate-intensity exercise).  Do strengthening exercises at least twice a week. This is in addition to the moderate-intensity exercise.  Spend less time sitting. Even light physical activity can be beneficial. Watch cholesterol and blood lipids Have your blood tested for lipids and cholesterol at 49 years of age, then have this test every 5 years. Have your cholesterol levels checked more often if:  Your lipid or cholesterol levels are high.  You are older than 49 years of age.  You are at high risk for heart disease. What should I know about cancer screening? Depending on your health history and family history, you may need to have cancer screening at various ages. This may include screening for:  Breast cancer.  Cervical cancer.  Colorectal cancer.  Skin cancer.  Lung cancer. What should I know about heart disease, diabetes, and high blood  pressure? Blood pressure and heart disease  High blood pressure causes heart disease and increases the risk of stroke. This is more likely to develop in people who have high blood pressure readings, are of African descent, or are overweight.  Have your blood pressure checked: ? Every 3-5 years if you are 18-39 years of age. ? Every year if you are 40 years old or older. Diabetes Have regular diabetes screenings. This checks your fasting blood sugar level. Have the screening done:  Once every three years after age 40 if you are at a normal weight and have a low risk for diabetes.  More often and at a younger age if you are overweight or have a high risk for diabetes. What should I know about preventing infection? Hepatitis B If you have a higher risk for hepatitis B, you should be screened for this virus. Talk with your health care provider to find out if you are at risk for hepatitis B infection. Hepatitis C Testing is recommended for:  Everyone born from 1945 through 1965.  Anyone with known risk factors for hepatitis C. Sexually transmitted infections (STIs)  Get screened for STIs, including gonorrhea and chlamydia, if: ? You are sexually active and are younger than 49 years of age. ? You are older than 49 years of age and your health care provider tells you that you are at risk for this type of infection. ? Your sexual activity has changed since you were last screened, and you are at increased risk for chlamydia or gonorrhea. Ask your health care provider if   you are at risk.  Ask your health care provider about whether you are at high risk for HIV. Your health care provider may recommend a prescription medicine to help prevent HIV infection. If you choose to take medicine to prevent HIV, you should first get tested for HIV. You should then be tested every 3 months for as long as you are taking the medicine. Pregnancy  If you are about to stop having your period (premenopausal) and  you may become pregnant, seek counseling before you get pregnant.  Take 400 to 800 micrograms (mcg) of folic acid every day if you become pregnant.  Ask for birth control (contraception) if you want to prevent pregnancy. Osteoporosis and menopause Osteoporosis is a disease in which the bones lose minerals and strength with aging. This can result in bone fractures. If you are 65 years old or older, or if you are at risk for osteoporosis and fractures, ask your health care provider if you should:  Be screened for bone loss.  Take a calcium or vitamin D supplement to lower your risk of fractures.  Be given hormone replacement therapy (HRT) to treat symptoms of menopause. Follow these instructions at home: Lifestyle  Do not use any products that contain nicotine or tobacco, such as cigarettes, e-cigarettes, and chewing tobacco. If you need help quitting, ask your health care provider.  Do not use street drugs.  Do not share needles.  Ask your health care provider for help if you need support or information about quitting drugs. Alcohol use  Do not drink alcohol if: ? Your health care provider tells you not to drink. ? You are pregnant, may be pregnant, or are planning to become pregnant.  If you drink alcohol: ? Limit how much you use to 0-1 drink a day. ? Limit intake if you are breastfeeding.  Be aware of how much alcohol is in your drink. In the [place] one drink equals one 12 oz bottle of beer (355 mL), one 5 oz glass of wine (148 mL), or one 1 oz glass of hard liquor (44 mL). General instructions  Schedule regular health, dental, and eye exams.  Stay current with your vaccines.  Tell your health care provider if: ? You often feel depressed. ? You have ever been abused or do not feel safe at home. Summary  Adopting a healthy lifestyle and getting preventive care are important in promoting health and wellness.  Follow your health care provider's instructions about healthy  diet, exercising, and getting tested or screened for diseases.  Follow your health care provider's instructions on monitoring your cholesterol and blood pressure. This information is not intended to replace advice given to you by your health care provider. Make sure you discuss any questions you have with your health care provider. Document Released: 11/14/2010 Document Revised: 04/24/2018 Document Reviewed: 04/24/2018 Elsevier Patient Education  2020 [employer]  

## 2018-12-10 NOTE — Assessment & Plan Note (Signed)
Flu shot yearly. Shingrix wants to get at 49. Tetanus up to date. Colonoscopy wants to get at 89. Mammogram done this year, pap smear up to date. Counseled about sun safety and mole surveillance. Counseled about the dangers of distracted driving. Given 10 year screening recommendations.

## 2018-12-25 ENCOUNTER — Encounter: Payer: Self-pay | Admitting: Internal Medicine

## 2018-12-25 DIAGNOSIS — D509 Iron deficiency anemia, unspecified: Secondary | ICD-10-CM

## 2019-01-02 DIAGNOSIS — Z20828 Contact with and (suspected) exposure to other viral communicable diseases: Secondary | ICD-10-CM | POA: Diagnosis not present

## 2019-02-04 ENCOUNTER — Other Ambulatory Visit (INDEPENDENT_AMBULATORY_CARE_PROVIDER_SITE_OTHER): Payer: 59

## 2019-02-04 ENCOUNTER — Other Ambulatory Visit: Payer: Self-pay

## 2019-02-04 DIAGNOSIS — D509 Iron deficiency anemia, unspecified: Secondary | ICD-10-CM

## 2019-02-04 LAB — FECAL OCCULT BLOOD, IMMUNOCHEMICAL: Fecal Occult Bld: NEGATIVE

## 2019-02-07 ENCOUNTER — Other Ambulatory Visit: Payer: Self-pay

## 2019-02-07 DIAGNOSIS — D509 Iron deficiency anemia, unspecified: Secondary | ICD-10-CM

## 2019-02-10 ENCOUNTER — Encounter: Payer: Self-pay | Admitting: Internal Medicine

## 2019-02-12 MED ORDER — TERBINAFINE HCL 250 MG PO TABS: 250.0000 mg | ORAL_TABLET | Freq: Every day | ORAL | 0 refills | Status: DC

## 2019-03-20 ENCOUNTER — Encounter: Payer: Self-pay | Admitting: Internal Medicine

## 2019-03-20 DIAGNOSIS — E611 Iron deficiency: Secondary | ICD-10-CM

## 2019-03-20 DIAGNOSIS — R7301 Impaired fasting glucose: Secondary | ICD-10-CM

## 2019-03-21 ENCOUNTER — Encounter: Payer: Self-pay | Admitting: Internal Medicine

## 2019-03-21 ENCOUNTER — Other Ambulatory Visit (INDEPENDENT_AMBULATORY_CARE_PROVIDER_SITE_OTHER): Payer: 59

## 2019-03-21 DIAGNOSIS — R7301 Impaired fasting glucose: Secondary | ICD-10-CM | POA: Diagnosis not present

## 2019-03-21 DIAGNOSIS — E611 Iron deficiency: Secondary | ICD-10-CM

## 2019-03-21 LAB — CBC WITH DIFFERENTIAL/PLATELET
Basophils Absolute: 0 10*3/uL (ref 0.0–0.1)
Basophils Relative: 0.5 % (ref 0.0–3.0)
Eosinophils Absolute: 0 10*3/uL (ref 0.0–0.7)
Eosinophils Relative: 1 % (ref 0.0–5.0)
HCT: 42 % (ref 36.0–46.0)
Hemoglobin: 14 g/dL (ref 12.0–15.0)
Lymphocytes Relative: 39.5 % (ref 12.0–46.0)
Lymphs Abs: 1.7 10*3/uL (ref 0.7–4.0)
MCHC: 33.4 g/dL (ref 30.0–36.0)
MCV: 96 fl (ref 78.0–100.0)
Monocytes Absolute: 0.3 10*3/uL (ref 0.1–1.0)
Monocytes Relative: 8.2 % (ref 3.0–12.0)
Neutro Abs: 2.1 10*3/uL (ref 1.4–7.7)
Neutrophils Relative %: 50.8 % (ref 43.0–77.0)
Platelets: 203 10*3/uL (ref 150.0–400.0)
RBC: 4.38 Mil/uL (ref 3.87–5.11)
RDW: 13.7 % (ref 11.5–15.5)
WBC: 4.2 10*3/uL (ref 4.0–10.5)

## 2019-03-21 LAB — RETICULOCYTES
ABS Retic: 44400 cells/uL (ref 20000–8000)
Retic Ct Pct: 1 %

## 2019-03-21 LAB — FERRITIN: Ferritin: 33.4 ng/mL (ref 10.0–291.0)

## 2019-03-21 LAB — HEMOGLOBIN A1C: Hgb A1c MFr Bld: 6 % (ref 4.6–6.5)

## 2019-07-08 DIAGNOSIS — H5213 Myopia, bilateral: Secondary | ICD-10-CM | POA: Diagnosis not present

## 2019-07-18 ENCOUNTER — Encounter: Payer: Self-pay | Admitting: Internal Medicine

## 2019-07-18 ENCOUNTER — Other Ambulatory Visit: Payer: Self-pay | Admitting: Internal Medicine

## 2019-07-18 DIAGNOSIS — Z1211 Encounter for screening for malignant neoplasm of colon: Secondary | ICD-10-CM

## 2019-08-15 ENCOUNTER — Encounter: Payer: Self-pay | Admitting: Internal Medicine

## 2019-08-15 DIAGNOSIS — Z1211 Encounter for screening for malignant neoplasm of colon: Secondary | ICD-10-CM

## 2019-08-19 NOTE — Telephone Encounter (Signed)
Okay to enter referral for GI - colon cancer screening.

## 2019-08-21 ENCOUNTER — Encounter: Payer: Self-pay | Admitting: Gastroenterology

## 2019-09-09 ENCOUNTER — Ambulatory Visit (AMBULATORY_SURGERY_CENTER): Payer: Self-pay | Admitting: *Deleted

## 2019-09-09 ENCOUNTER — Other Ambulatory Visit: Payer: Self-pay

## 2019-09-09 VITALS — Temp 97.3°F | Ht 64.0 in | Wt 129.0 lb

## 2019-09-09 DIAGNOSIS — R0789 Other chest pain: Secondary | ICD-10-CM

## 2019-09-09 DIAGNOSIS — D509 Iron deficiency anemia, unspecified: Secondary | ICD-10-CM

## 2019-09-09 MED ORDER — SUPREP BOWEL PREP KIT 17.5-3.13-1.6 GM/177ML PO SOLN: 1.0000 | Freq: Once | ORAL | 0 refills | Status: AC

## 2019-09-09 NOTE — Progress Notes (Signed)
No egg or soy allergy known to patient  No issues with past sedation with any surgeries  or procedures, no intubation problems  No diet pills per patient No home 02 use per patient  No blood thinners per patient  Pt denies issues with constipation  No A fib or A flutter  EMMI video sent to pt's e mail   Completed covid vaccines 05-2019- early Jan per Dr Burr Medico   Due to the COVID-19 pandemic we are asking patients to follow these guidelines. Please only bring one care partner. Please be aware that your care partner may wait in the car in the parking lot or if they feel like they will be too hot to wait in the car, they may wait in the lobby on the 4th floor. All care partners are required to wear a mask the entire time (we do not have any that we can provide them), they need to practice social distancing, and we will do a Covid check for all patient's and care partners when you arrive. Also we will check their temperature and your temperature. If the care partner waits in their car they need to stay in the parking lot the entire time and we will call them on their cell phone when the patient is ready for discharge so they can bring the car to the front of the building. Also all patient's will need to wear a mask into building.

## 2019-09-19 ENCOUNTER — Encounter: Payer: Self-pay | Admitting: Gastroenterology

## 2019-09-23 ENCOUNTER — Encounter: Payer: Self-pay | Admitting: Gastroenterology

## 2019-09-23 ENCOUNTER — Ambulatory Visit (AMBULATORY_SURGERY_CENTER): Payer: 59 | Admitting: Gastroenterology

## 2019-09-23 ENCOUNTER — Other Ambulatory Visit: Payer: Self-pay

## 2019-09-23 ENCOUNTER — Other Ambulatory Visit: Payer: Self-pay | Admitting: Internal Medicine

## 2019-09-23 VITALS — BP 100/66 | HR 75 | Temp 96.6°F | Resp 16 | Ht 64.0 in | Wt 129.0 lb

## 2019-09-23 DIAGNOSIS — E119 Type 2 diabetes mellitus without complications: Secondary | ICD-10-CM | POA: Diagnosis not present

## 2019-09-23 DIAGNOSIS — D509 Iron deficiency anemia, unspecified: Secondary | ICD-10-CM

## 2019-09-23 DIAGNOSIS — K635 Polyp of colon: Secondary | ICD-10-CM

## 2019-09-23 DIAGNOSIS — D122 Benign neoplasm of ascending colon: Secondary | ICD-10-CM

## 2019-09-23 DIAGNOSIS — Z1231 Encounter for screening mammogram for malignant neoplasm of breast: Secondary | ICD-10-CM

## 2019-09-23 DIAGNOSIS — K219 Gastro-esophageal reflux disease without esophagitis: Secondary | ICD-10-CM | POA: Diagnosis not present

## 2019-09-23 DIAGNOSIS — R0789 Other chest pain: Secondary | ICD-10-CM

## 2019-09-23 DIAGNOSIS — Z1211 Encounter for screening for malignant neoplasm of colon: Secondary | ICD-10-CM | POA: Diagnosis not present

## 2019-09-23 MED ORDER — SODIUM CHLORIDE 0.9 % IV SOLN: 500.0000 mL | Freq: Once | INTRAVENOUS | Status: DC

## 2019-09-23 NOTE — Progress Notes (Signed)
Report given to PACU, vss 

## 2019-09-23 NOTE — Progress Notes (Signed)
Pt's states no medical or surgical changes since previsit or office visit. 

## 2019-09-23 NOTE — Patient Instructions (Signed)
HANDOUTS PROVIDED ON: POLYPS  The polyp removed/biopsies taken today have been sent for pathology.  The results can take 1-3 weeks to receive.  When your next colonoscopy should occur will be based on the pathology results.    You may resume your previous diet and medication schedule.  Thank you for allowing [place] to care for you today!!!   YOU HAD AN ENDOSCOPIC PROCEDURE TODAY AT [facility]:   Refer to the procedure report that was given to you for any specific questions about what was found during the examination.  If the procedure report does not answer your questions, please call your gastroenterologist to clarify.  If you requested that your care partner not be given the details of your procedure findings, then the procedure report has been included in a sealed envelope for you to review at your convenience later.  YOU SHOULD EXPECT: Some feelings of bloating in the abdomen. Passage of more gas than usual.  Walking can help get rid of the air that was put into your GI tract during the procedure and reduce the bloating. If you had a lower endoscopy (such as a colonoscopy or flexible sigmoidoscopy) you may notice spotting of blood in your stool or on the toilet paper. If you underwent a bowel prep for your procedure, you may not have a normal bowel movement for a few days.  Please Note:  You might notice some irritation and congestion in your nose or some drainage.  This is from the oxygen used during your procedure.  There is no need for concern and it should clear up in a day or so.  SYMPTOMS TO REPORT IMMEDIATELY:   Following lower endoscopy (colonoscopy or flexible sigmoidoscopy):  Excessive amounts of blood in the stool  Significant tenderness or worsening of abdominal pains  Swelling of the abdomen that is new, acute  Fever of 100F or higher   Following upper endoscopy (EGD)  Vomiting of blood or coffee ground material  New chest pain or pain under the shoulder  blades  Painful or persistently difficult swallowing  New shortness of breath  Fever of 100F or higher  Black, tarry-looking stools  For urgent or emergent issues, a gastroenterologist can be reached at any hour by calling [phone]. Do not use MyChart messaging for urgent concerns.    DIET:  We do recommend a small meal at first, but then you may proceed to your regular diet.  Drink plenty of fluids but you should avoid alcoholic beverages for 24 hours.  ACTIVITY:  You should plan to take it easy for the rest of today and you should NOT DRIVE or use heavy machinery until tomorrow (because of the sedation medicines used during the test).    FOLLOW UP: Our staff will call the number listed on your records 48-72 hours following your procedure to check on you and address any questions or concerns that you may have regarding the information given to you following your procedure. If we do not reach you, we will leave a message.  We will attempt to reach you two times.  During this call, we will ask if you have developed any symptoms of COVID 19. If you develop any symptoms (ie: fever, flu-like symptoms, shortness of breath, cough etc.) before then, please call [phone].  If you test positive for Covid 19 in the 2 weeks post procedure, please call and report this information to [place].    If any biopsies were taken you will be  contacted by phone or by letter within the next 1-3 weeks.  Please call us at [phone] if you have not heard about the biopsies in 3 weeks.    SIGNATURES/CONFIDENTIALITY: You and/or your care partner have signed paperwork which will be entered into your electronic medical record.  These signatures attest to the fact that that the information above on your After Visit Summary has been reviewed and is understood.  Full responsibility of the confidentiality of this discharge information lies with you and/or your care-partner.

## 2019-09-23 NOTE — Progress Notes (Signed)
Called to room to assist during endoscopic procedure.  Patient ID and intended procedure confirmed with present staff. Received instructions for my participation in the procedure from the performing physician.  

## 2019-09-23 NOTE — Op Note (Signed)
[place] Patient Name: Shirley Tucker Procedure Date: 09/23/2019 1:29 PM MRN: [id number] Endoscopist: Milus Banister , MD Age: 48 Referring MD:  Date of Birth: 08-Dec-1970 Gender: Female Account #: [id number] Procedure:                Upper GI endoscopy Indications:              Iron deficiency anemia, atypical chest pain Medicines:                Monitored Anesthesia Care Procedure:                Pre-Anesthesia Assessment:                           - Prior to the procedure, a History and Physical                            was performed, and patient medications and                            allergies were reviewed. The patient's tolerance of                            previous anesthesia was also reviewed. The risks                            and benefits of the procedure and the sedation                            options and risks were discussed with the patient.                            All questions were answered, and informed consent                            was obtained. Prior Anticoagulants: The patient has                            taken no previous anticoagulant or antiplatelet                            agents. ASA Grade Assessment: I - A normal, healthy                            patient. After reviewing the risks and benefits,                            the patient was deemed in satisfactory condition to                            undergo the procedure.                           After obtaining informed consent, the endoscope was  passed under direct vision. Throughout the                            procedure, the patient's blood pressure, pulse, and                            oxygen saturations were monitored continuously. The                            Endoscope was introduced through the mouth, and                            advanced to the second part of duodenum. The upper                            GI endoscopy was accomplished  without difficulty.                            The patient tolerated the procedure well. Scope In: Scope Out: Findings:                 The esophagus was normal.                           The stomach was normal.                           The examined duodenum was normal.                           Biopsies for histology were taken with a cold                            forceps in the duodenal bulb and in the second                            portion of the duodenum for evaluation of celiac                            disease. Complications:            No immediate complications. Estimated blood loss:                            None. Estimated Blood Loss:     Estimated blood loss: none. Impression:               - Normal esophagus.                           - Normal stomach.                           - Normal examined duodenum.                           -                           -  Biopsies were taken from the duodenum with a cold                            forceps for evaluation of celiac disease. Recommendation:           - Patient has a contact number available for                            emergencies. The signs and symptoms of potential                            delayed complications were discussed with the                            patient. Return to normal activities tomorrow.                            Written discharge instructions were provided to the                            patient.                           - Resume previous diet.                           - Continue present medications.                           - Await pathology results. Milus Banister, MD 09/23/2019 1:57:37 PM This report has been signed electronically.

## 2019-09-23 NOTE — Op Note (Signed)
[place] Patient Name: Shirley Tucker Procedure Date: 09/23/2019 1:30 PM MRN: [id number] Endoscopist: Milus Banister , MD Age: 49 Referring MD:  Date of Birth: May 14, 1971 Gender: Female Account #: [id number] Procedure:                Colonoscopy Indications:              Screening for colorectal malignant neoplasm Medicines:                Monitored Anesthesia Care Procedure:                Pre-Anesthesia Assessment:                           - Prior to the procedure, a History and Physical                            was performed, and patient medications and                            allergies were reviewed. The patient's tolerance of                            previous anesthesia was also reviewed. The risks                            and benefits of the procedure and the sedation                            options and risks were discussed with the patient.                            All questions were answered, and informed consent                            was obtained. Prior Anticoagulants: The patient has                            taken no previous anticoagulant or antiplatelet                            agents. ASA Grade Assessment: I - A normal, healthy                            patient. After reviewing the risks and benefits,                            the patient was deemed in satisfactory condition to                            undergo the procedure.                           After obtaining informed consent, the colonoscope  was passed under direct vision. Throughout the                            procedure, the patient's blood pressure, pulse, and                            oxygen saturations were monitored continuously. The                            Colonoscope was introduced through the anus and                            advanced to the the cecum, identified by                            appendiceal orifice and ileocecal valve. The                             colonoscopy was performed without difficulty. The                            patient tolerated the procedure well. The quality                            of the bowel preparation was good. The ileocecal                            valve, appendiceal orifice, and rectum were                            photographed. Scope In: 1:35:41 PM Scope Out: 1:48:53 PM Scope Withdrawal Time: 0 hours 8 minutes 54 seconds  Total Procedure Duration: 0 hours 13 minutes 12 seconds  Findings:                 A 2 mm polyp was found in the ascending colon. The                            polyp was sessile. The polyp was removed with a                            cold snare. Resection and retrieval were complete.                           The exam was otherwise without abnormality on                            direct and retroflexion views. Complications:            No immediate complications. Estimated blood loss:                            None. Estimated Blood Loss:     Estimated blood loss: none. Impression:               -  One 2 mm polyp in the ascending colon, removed                            with a cold snare. Resected and retrieved.                           - The examination was otherwise normal on direct                            and retroflexion views. Recommendation:           - EGD now for IDA, atypical chest pain.                           - Await pathology results. Milus Banister, MD 09/23/2019 1:54:57 PM This report has been signed electronically.

## 2019-09-25 ENCOUNTER — Telehealth: Payer: Self-pay

## 2019-09-25 NOTE — Telephone Encounter (Signed)
  Follow up Call-  Call back number 09/23/2019  Post procedure Call Back phone  # [phone]  Permission to leave phone message Yes  Some recent data might be hidden     Patient questions:  Do you have a fever, pain , or abdominal swelling? No. Pain Score  0 *  Have you tolerated food without any problems? Yes.    Have you been able to return to your normal activities? Yes.    Do you have any questions about your discharge instructions: Diet   No. Medications  No. Follow up visit  No.  Do you have questions or concerns about your Care? No.  Actions: * If pain score is 4 or above: No action needed, pain <4.  1. Have you developed a fever since your procedure? no  2.   Have you had an respiratory symptoms (SOB or cough) since your procedure? no  3.   Have you tested positive for COVID 19 since your procedure no  4.   Have you had any family members/close contacts diagnosed with the COVID 19 since your procedure?  no   If yes to any of these questions please route to Joylene John, RN and Erenest Rasher, RN

## 2019-09-30 ENCOUNTER — Other Ambulatory Visit: Payer: Self-pay

## 2019-09-30 DIAGNOSIS — D509 Iron deficiency anemia, unspecified: Secondary | ICD-10-CM

## 2019-11-11 ENCOUNTER — Ambulatory Visit
Admission: RE | Admit: 2019-11-11 | Discharge: 2019-11-11 | Disposition: A | Discharge status: Home / Self Care | Payer: 59 | Source: Ambulatory Visit | Attending: Internal Medicine | Admitting: Internal Medicine

## 2019-11-11 ENCOUNTER — Other Ambulatory Visit: Payer: Self-pay

## 2019-11-11 DIAGNOSIS — Z1231 Encounter for screening mammogram for malignant neoplasm of breast: Secondary | ICD-10-CM

## 2019-12-16 ENCOUNTER — Ambulatory Visit (INDEPENDENT_AMBULATORY_CARE_PROVIDER_SITE_OTHER): Payer: 59 | Admitting: Internal Medicine

## 2019-12-16 ENCOUNTER — Other Ambulatory Visit: Payer: Self-pay

## 2019-12-16 ENCOUNTER — Encounter: Payer: Self-pay | Admitting: Internal Medicine

## 2019-12-16 VITALS — BP 118/78 | HR 88 | Temp 98.5°F | Ht 64.0 in | Wt 127.0 lb

## 2019-12-16 DIAGNOSIS — Z0000 Encounter for general adult medical examination without abnormal findings: Secondary | ICD-10-CM

## 2019-12-16 DIAGNOSIS — Z23 Encounter for immunization: Secondary | ICD-10-CM

## 2019-12-16 DIAGNOSIS — F5101 Primary insomnia: Secondary | ICD-10-CM | POA: Diagnosis not present

## 2019-12-16 DIAGNOSIS — R7301 Impaired fasting glucose: Secondary | ICD-10-CM | POA: Diagnosis not present

## 2019-12-16 NOTE — Progress Notes (Signed)
   Subjective:   Patient ID: Shirley Tucker, female    DOB: Jul 16, 1970, 49 y.o.   MRN: [id number]  HPI The patient is a 49 YO female coming in for physical.   PMH, Cole, social history reviewed and updated  Review of Systems  Constitutional: Negative.   HENT: Negative.   Eyes: Negative.   Respiratory: Negative for cough, chest tightness and shortness of breath.   Cardiovascular: Negative for chest pain, palpitations and leg swelling.  Gastrointestinal: Negative for abdominal distention, abdominal pain, constipation, diarrhea, nausea and vomiting.  Musculoskeletal: Negative.   Skin: Negative.   Neurological: Negative.   Psychiatric/Behavioral: Negative.     Objective:  Physical Exam Constitutional:      Appearance: She is well-developed.  HENT:     Head: Normocephalic and atraumatic.  Cardiovascular:     Rate and Rhythm: Normal rate and regular rhythm.  Pulmonary:     Effort: Pulmonary effort is normal. No respiratory distress.     Breath sounds: Normal breath sounds. No wheezing or rales.  Abdominal:     General: Bowel sounds are normal. There is no distension.     Palpations: Abdomen is soft.     Tenderness: There is no abdominal tenderness. There is no rebound.  Musculoskeletal:     Cervical back: Normal range of motion.  Skin:    General: Skin is warm and dry.  Neurological:     Mental Status: She is alert and oriented to person, place, and time.     Coordination: Coordination normal.     Vitals:   12/16/19 1412  BP: 118/78  Pulse: 88  Temp: 98.5 F (36.9 C)  TempSrc: Oral  SpO2: 97%  Weight: 127 lb (57.6 kg)  Height: 5\' 4"  (1.626 m)    This visit occurred during the SARS-CoV-2 public health emergency.  Safety protocols were in place, including screening questions prior to the visit, additional usage of staff PPE, and extensive cleaning of exam room while observing appropriate contact time as indicated for disinfecting solutions.   Assessment & Plan:  Shingrix  IM given at visit

## 2019-12-16 NOTE — Patient Instructions (Signed)
Health Maintenance, Female Adopting a healthy lifestyle and getting preventive care are important in promoting health and wellness. Ask your health care provider about:  The right schedule for you to have regular tests and exams.  Things you can do on your own to prevent diseases and keep yourself healthy. What should I know about diet, weight, and exercise? Eat a healthy diet   Eat a diet that includes plenty of vegetables, fruits, low-fat dairy products, and lean protein.  Do not eat a lot of foods that are high in solid fats, added sugars, or sodium. Maintain a healthy weight Body mass index (BMI) is used to identify weight problems. It estimates body fat based on height and weight. Your health care provider can help determine your BMI and help you achieve or maintain a healthy weight. Get regular exercise Get regular exercise. This is one of the most important things you can do for your health. Most adults should:  Exercise for at least 150 minutes each week. The exercise should increase your heart rate and make you sweat (moderate-intensity exercise).  Do strengthening exercises at least twice a week. This is in addition to the moderate-intensity exercise.  Spend less time sitting. Even light physical activity can be beneficial. Watch cholesterol and blood lipids Have your blood tested for lipids and cholesterol at 50 years of age, then have this test every 5 years. Have your cholesterol levels checked more often if:  Your lipid or cholesterol levels are high.  You are older than 50 years of age.  You are at high risk for heart disease. What should I know about cancer screening? Depending on your health history and family history, you may need to have cancer screening at various ages. This may include screening for:  Breast cancer.  Cervical cancer.  Colorectal cancer.  Skin cancer.  Lung cancer. What should I know about heart disease, diabetes, and high blood  pressure? Blood pressure and heart disease  High blood pressure causes heart disease and increases the risk of stroke. This is more likely to develop in people who have high blood pressure readings, are of African descent, or are overweight.  Have your blood pressure checked: ? Every 3-5 years if you are 18-39 years of age. ? Every year if you are 40 years old or older. Diabetes Have regular diabetes screenings. This checks your fasting blood sugar level. Have the screening done:  Once every three years after age 40 if you are at a normal weight and have a low risk for diabetes.  More often and at a younger age if you are overweight or have a high risk for diabetes. What should I know about preventing infection? Hepatitis B If you have a higher risk for hepatitis B, you should be screened for this virus. Talk with your health care provider to find out if you are at risk for hepatitis B infection. Hepatitis C Testing is recommended for:  Everyone born from 1945 through 1965.  Anyone with known risk factors for hepatitis C. Sexually transmitted infections (STIs)  Get screened for STIs, including gonorrhea and chlamydia, if: ? You are sexually active and are younger than 50 years of age. ? You are older than 50 years of age and your health care provider tells you that you are at risk for this type of infection. ? Your sexual activity has changed since you were last screened, and you are at increased risk for chlamydia or gonorrhea. Ask your health care provider if   you are at risk.  Ask your health care provider about whether you are at high risk for HIV. Your health care provider may recommend a prescription medicine to help prevent HIV infection. If you choose to take medicine to prevent HIV, you should first get tested for HIV. You should then be tested every 3 months for as long as you are taking the medicine. Pregnancy  If you are about to stop having your period (premenopausal) and  you may become pregnant, seek counseling before you get pregnant.  Take 400 to 800 micrograms (mcg) of folic acid every day if you become pregnant.  Ask for birth control (contraception) if you want to prevent pregnancy. Osteoporosis and menopause Osteoporosis is a disease in which the bones lose minerals and strength with aging. This can result in bone fractures. If you are 65 years old or older, or if you are at risk for osteoporosis and fractures, ask your health care provider if you should:  Be screened for bone loss.  Take a calcium or vitamin D supplement to lower your risk of fractures.  Be given hormone replacement therapy (HRT) to treat symptoms of menopause. Follow these instructions at home: Lifestyle  Do not use any products that contain nicotine or tobacco, such as cigarettes, e-cigarettes, and chewing tobacco. If you need help quitting, ask your health care provider.  Do not use street drugs.  Do not share needles.  Ask your health care provider for help if you need support or information about quitting drugs. Alcohol use  Do not drink alcohol if: ? Your health care provider tells you not to drink. ? You are pregnant, may be pregnant, or are planning to become pregnant.  If you drink alcohol: ? Limit how much you use to 0-1 drink a day. ? Limit intake if you are breastfeeding.  Be aware of how much alcohol is in your drink. In the [place] one drink equals one 12 oz bottle of beer (355 mL), one 5 oz glass of wine (148 mL), or one 1 oz glass of hard liquor (44 mL). General instructions  Schedule regular health, dental, and eye exams.  Stay current with your vaccines.  Tell your health care provider if: ? You often feel depressed. ? You have ever been abused or do not feel safe at home. Summary  Adopting a healthy lifestyle and getting preventive care are important in promoting health and wellness.  Follow your health care provider's instructions about healthy  diet, exercising, and getting tested or screened for diseases.  Follow your health care provider's instructions on monitoring your cholesterol and blood pressure. This information is not intended to replace advice given to you by your health care provider. Make sure you discuss any questions you have with your health care provider. Document Revised: 04/24/2018 Document Reviewed: 04/24/2018 Elsevier Patient Education  2020 [employer]  

## 2019-12-17 MED ORDER — ZOLPIDEM TARTRATE 10 MG PO TABS: 10.0000 mg | ORAL_TABLET | Freq: Every evening | ORAL | 1 refills | Status: DC | PRN

## 2019-12-17 NOTE — Assessment & Plan Note (Signed)
Checking Hga1c and adjust as needed.  

## 2019-12-17 NOTE — Assessment & Plan Note (Signed)
Refill Shirley Tucker which she uses rarely.

## 2019-12-17 NOTE — Assessment & Plan Note (Signed)
Flu shot yearly. Covid-19 up to date. Shingrix 1st given today. Tetanus up to date. Colonoscopy up to date. Mammogram up to date, pap smear will get with gyn in near future. Hep C screening done today. Counseled about sun safety and mole surveillance. Counseled about the dangers of distracted driving. Given 10 year screening recommendations.

## 2019-12-24 ENCOUNTER — Other Ambulatory Visit: Payer: 59

## 2019-12-24 DIAGNOSIS — Z0000 Encounter for general adult medical examination without abnormal findings: Secondary | ICD-10-CM | POA: Diagnosis not present

## 2019-12-24 LAB — CBC
HCT: 41.3 % (ref 36.0–46.0)
Hemoglobin: 13.8 g/dL (ref 12.0–15.0)
MCHC: 33.4 g/dL (ref 30.0–36.0)
MCV: 94.3 fl (ref 78.0–100.0)
Platelets: 220 10*3/uL (ref 150.0–400.0)
RBC: 4.38 Mil/uL (ref 3.87–5.11)
RDW: 13.8 % (ref 11.5–15.5)
WBC: 3.7 10*3/uL — ABNORMAL LOW (ref 4.0–10.5)

## 2019-12-24 LAB — LIPID PANEL
Cholesterol: 176 mg/dL (ref 0–200)
HDL: 56.1 mg/dL (ref >39.00)
LDL Cholesterol: 110 mg/dL — ABNORMAL HIGH (ref 0–99)
NonHDL: 119.7
Total CHOL/HDL Ratio: 3
Triglycerides: 51 mg/dL (ref 0.0–149.0)
VLDL: 10.2 mg/dL (ref 0.0–40.0)

## 2019-12-24 LAB — TSH: TSH: 1.83 u[IU]/mL (ref 0.35–4.50)

## 2019-12-24 LAB — HEMOGLOBIN A1C: Hgb A1c MFr Bld: 5.8 % (ref 4.6–6.5)

## 2019-12-24 LAB — FERRITIN: Ferritin: 34.1 ng/mL (ref 10.0–291.0)

## 2019-12-24 NOTE — Addendum Note (Signed)
Addended by: Jacob Moores on: 12/24/2019 07:31 AM   Modules accepted: Orders

## 2019-12-25 LAB — HEPATITIS C ANTIBODY
Hepatitis C Ab: NONREACTIVE
SIGNAL TO CUT-OFF: 0.01 (ref <1.00)

## 2020-01-07 DIAGNOSIS — Z20822 Contact with and (suspected) exposure to covid-19: Secondary | ICD-10-CM | POA: Diagnosis not present

## 2020-02-10 ENCOUNTER — Encounter: Payer: Self-pay | Admitting: Internal Medicine

## 2020-02-17 DIAGNOSIS — Z20822 Contact with and (suspected) exposure to covid-19: Secondary | ICD-10-CM | POA: Diagnosis not present

## 2020-02-20 ENCOUNTER — Ambulatory Visit: Payer: 59

## 2020-02-27 ENCOUNTER — Ambulatory Visit (INDEPENDENT_AMBULATORY_CARE_PROVIDER_SITE_OTHER): Payer: 59 | Admitting: *Deleted

## 2020-02-27 ENCOUNTER — Other Ambulatory Visit: Payer: Self-pay

## 2020-02-27 DIAGNOSIS — Z23 Encounter for immunization: Secondary | ICD-10-CM

## 2020-03-05 ENCOUNTER — Inpatient Hospital Stay: Payer: 59 | Attending: Internal Medicine

## 2020-03-05 DIAGNOSIS — Z23 Encounter for immunization: Secondary | ICD-10-CM | POA: Diagnosis not present

## 2020-03-05 NOTE — Progress Notes (Signed)
° °  Covid-19 Vaccination Clinic  Name:  Shirley Tucker    MRN: [id number] DOB: 04-23-1971  03/05/2020  [name] was observed post Covid-19 immunization for 15 minutes without incident. She was provided with Vaccine Information Sheet and instruction to access the V-Safe system.   [name] was instructed to call 911 with any severe reactions post vaccine:  Difficulty breathing   Swelling of face and throat   A fast heartbeat   A bad rash all over body   Dizziness and weakness

## 2020-05-24 ENCOUNTER — Telehealth: Payer: Self-pay | Admitting: Internal Medicine

## 2020-05-24 DIAGNOSIS — R300 Dysuria: Secondary | ICD-10-CM

## 2020-05-24 NOTE — Telephone Encounter (Signed)
[facility] FYI   Caller states that she thinks she has a UTI she is wanting antibiotics prescribed. She has urinary frequency and burning.   [facility] attempted several times to reach the patient. They were unable to leave a message.

## 2020-05-25 ENCOUNTER — Other Ambulatory Visit (INDEPENDENT_AMBULATORY_CARE_PROVIDER_SITE_OTHER): Payer: 59

## 2020-05-25 ENCOUNTER — Encounter: Payer: Self-pay | Admitting: Internal Medicine

## 2020-05-25 DIAGNOSIS — R300 Dysuria: Secondary | ICD-10-CM | POA: Diagnosis not present

## 2020-05-25 LAB — URINALYSIS, ROUTINE W REFLEX MICROSCOPIC
Bilirubin Urine: NEGATIVE
Hgb urine dipstick: NEGATIVE
Ketones, ur: NEGATIVE
Nitrite: NEGATIVE
RBC / HPF: NONE SEEN (ref 0–?)
Specific Gravity, Urine: 1.02 (ref 1.000–1.030)
Total Protein, Urine: NEGATIVE
Urine Glucose: NEGATIVE
Urobilinogen, UA: 0.2 (ref 0.0–1.0)
pH: 7 (ref 5.0–8.0)

## 2020-05-25 NOTE — Addendum Note (Signed)
Addended by: Pricilla Holm A on: 05/25/2020 03:35 PM   Modules accepted: Orders

## 2020-05-25 NOTE — Telephone Encounter (Signed)
Labs placed can leave sample.

## 2020-05-25 NOTE — Telephone Encounter (Signed)
error 

## 2020-05-25 NOTE — Telephone Encounter (Signed)
Pt states she had dysuria this past w/e that subsided, however, it has started again.  Pt would like to know if she needs to give sample to get antibiotics.

## 2020-05-25 NOTE — Telephone Encounter (Signed)
Pt notified that lab placed for urine specimen.  Pt states she will got to [place] location today.

## 2020-05-26 ENCOUNTER — Other Ambulatory Visit: Payer: Self-pay | Admitting: Internal Medicine

## 2020-05-26 MED ORDER — NITROFURANTOIN MONOHYD MACRO 100 MG PO CAPS: 100.0000 mg | ORAL_CAPSULE | Freq: Two times a day (BID) | ORAL | 0 refills | Status: DC

## 2020-05-27 LAB — URINE CULTURE
MICRO NUMBER:: 11404712
SPECIMEN QUALITY:: ADEQUATE

## 2020-06-01 DIAGNOSIS — Z20822 Contact with and (suspected) exposure to covid-19: Secondary | ICD-10-CM | POA: Diagnosis not present

## 2020-06-10 DIAGNOSIS — Z20822 Contact with and (suspected) exposure to covid-19: Secondary | ICD-10-CM | POA: Diagnosis not present

## 2020-08-12 ENCOUNTER — Other Ambulatory Visit: Payer: Self-pay | Admitting: Internal Medicine

## 2020-08-12 DIAGNOSIS — Z1231 Encounter for screening mammogram for malignant neoplasm of breast: Secondary | ICD-10-CM

## 2020-09-07 DIAGNOSIS — H524 Presbyopia: Secondary | ICD-10-CM | POA: Diagnosis not present

## 2020-09-07 DIAGNOSIS — H5213 Myopia, bilateral: Secondary | ICD-10-CM | POA: Diagnosis not present

## 2020-09-07 DIAGNOSIS — H52223 Regular astigmatism, bilateral: Secondary | ICD-10-CM | POA: Diagnosis not present

## 2020-09-09 ENCOUNTER — Other Ambulatory Visit (HOSPITAL_COMMUNITY): Payer: Self-pay

## 2020-09-09 ENCOUNTER — Encounter: Payer: Self-pay | Admitting: Internal Medicine

## 2020-09-09 MED ORDER — NITROFURANTOIN MONOHYD MACRO 100 MG PO CAPS
100.0000 mg | ORAL_CAPSULE | Freq: Two times a day (BID) | ORAL | 0 refills | Status: DC
  Filled 2020-09-09: qty 14, 7d supply, fill #0

## 2020-09-11 ENCOUNTER — Other Ambulatory Visit: Payer: Self-pay

## 2020-09-11 ENCOUNTER — Ambulatory Visit (HOSPITAL_COMMUNITY)
Admission: EM | Admit: 2020-09-11 | Discharge: 2020-09-11 | Disposition: A | Discharge status: Home / Self Care | Payer: 59 | Attending: Urgent Care | Admitting: Urgent Care

## 2020-09-11 ENCOUNTER — Encounter (HOSPITAL_COMMUNITY): Payer: Self-pay

## 2020-09-11 DIAGNOSIS — R109 Unspecified abdominal pain: Secondary | ICD-10-CM | POA: Insufficient documentation

## 2020-09-11 DIAGNOSIS — N3090 Cystitis, unspecified without hematuria: Secondary | ICD-10-CM | POA: Diagnosis not present

## 2020-09-11 DIAGNOSIS — B9689 Other specified bacterial agents as the cause of diseases classified elsewhere: Secondary | ICD-10-CM

## 2020-09-11 LAB — POCT URINALYSIS DIPSTICK, ED / UC
Bilirubin Urine: NEGATIVE
Glucose, UA: NEGATIVE mg/dL
Hgb urine dipstick: NEGATIVE
Leukocytes,Ua: NEGATIVE
Nitrite: NEGATIVE
Protein, ur: NEGATIVE mg/dL
Specific Gravity, Urine: 1.025 (ref 1.005–1.030)
Urobilinogen, UA: 0.2 mg/dL (ref 0.0–1.0)
pH: 5.5 (ref 5.0–8.0)

## 2020-09-11 MED ORDER — TAMSULOSIN HCL 0.4 MG PO CAPS: 0.4000 mg | ORAL_CAPSULE | Freq: Every day | ORAL | 0 refills | Status: DC

## 2020-09-11 MED ORDER — TIZANIDINE HCL 4 MG PO TABS: 4.0000 mg | ORAL_TABLET | Freq: Three times a day (TID) | ORAL | 0 refills | Status: DC | PRN

## 2020-09-11 NOTE — ED Provider Notes (Signed)
[facility]   MRN: [id number] DOB: 05-29-1970  Subjective:   Shirley Tucker is a 50 y.o. female presenting for 1 day history of bilateral flank pain worse on the right side.  Patient is actually taking antibiotics for an urinary tract infection, is on Macrobid.  States that her symptoms have improved dramatically with this but she became concerned about her flank pain as it started yesterday.  Denies fever, nausea, vomiting, abdominal pain, dysuria, hematuria.  Denies history of renal stones.  No current facility-administered medications for this encounter.  Current Outpatient Medications:  .  blood glucose meter kit and supplies, Patient wants accucheck Dispense based on patient and insurance preference. Use up to four times daily as directed. (FOR ICD-10 E10.9, E11.9)., Disp: 1 each, Rfl: 0 .  glucose blood (TRUE METRIX BLOOD GLUCOSE TEST) test strip, Use as instructed check sugars daily, Disp: 100 each, Rfl: 12 .  Lancet Device MISC, Needs true metrix, Disp: 100 each, Rfl: 11 .  Lancets (ONETOUCH ULTRASOFT) lancets, Use as instructed to check sugars daily, Disp: 100 each, Rfl: 12 .  Multiple Vitamin (MULTIVITAMIN) tablet, Take 1 tablet by mouth daily., Disp: , Rfl:  .  nitrofurantoin, macrocrystal-monohydrate, (MACROBID) 100 MG capsule, Take 1 capsule (100 mg total) by mouth 2 (two) times daily., Disp: 14 capsule, Rfl: 0 .  zolpidem (AMBIEN) 10 MG tablet, Take 1 tablet (10 mg total) by mouth at bedtime as needed for sleep., Disp: 30 tablet, Rfl: 1   Allergies  Allergen Reactions  . Penicillins Other (See Comments)    Patient reported passing out    Past Medical History:  Diagnosis Date  . Anemia   . Atypical chest pain   . Borderline diabetes    no meds- uses monitor to cjeck PRN- A1C 6 per pt   . GERD (gastroesophageal reflux disease)   . Heart murmur    asymptomatic      Past Surgical History:  Procedure Laterality Date  . KNEE SURGERY Bilateral 1992  .  WISDOM TOOTH EXTRACTION     almost 25 yrs     Family History  Problem Relation Age of Onset  . Hypertension Mother   . Dementia Mother   . Hypertension Father   . Heart disease Father   . Other Father        gastric polyps   . Hypertension Maternal Grandmother   . Hypertension Maternal Grandfather   . Colon cancer Neg Hx   . Colon polyps Neg Hx   . Esophageal cancer Neg Hx   . Rectal cancer Neg Hx   . Stomach cancer Neg Hx     Social History   Tobacco Use  . Smoking status: Never Smoker  . Smokeless tobacco: Never Used  Vaping Use  . Vaping Use: Never used  Substance Use Topics  . Alcohol use: Yes    Alcohol/week: 0.0 standard drinks    Comment: Social  . Drug use: No    ROS   Objective:   Vitals: BP 119/73 (BP Location: Right Arm)   Pulse 95   Temp 99.5 F (37.5 C) (Temporal)   Resp 16   SpO2 100%   Physical Exam Constitutional:      General: She is not in acute distress.    Appearance: Normal appearance. She is well-developed. She is not ill-appearing, toxic-appearing or diaphoretic.  HENT:     Head: Normocephalic and atraumatic.     Nose: Nose normal.     Mouth/Throat:  Mouth: Mucous membranes are moist.     Pharynx: Oropharynx is clear.  Eyes:     General: No scleral icterus.    Extraocular Movements: Extraocular movements intact.     Pupils: Pupils are equal, round, and reactive to light.  Cardiovascular:     Rate and Rhythm: Normal rate.  Pulmonary:     Effort: Pulmonary effort is normal.  Abdominal:     General: There is no distension.     Palpations: Abdomen is soft. There is no mass.     Tenderness: There is no abdominal tenderness. There is no right CVA tenderness, left CVA tenderness, guarding or rebound.  Skin:    General: Skin is warm and dry.  Neurological:     General: No focal deficit present.     Mental Status: She is alert and oriented to person, place, and time.  Psychiatric:        Mood and Affect: Mood normal.         Behavior: Behavior normal.        Thought Content: Thought content normal.        Judgment: Judgment normal.     Results for orders placed or performed during the hospital encounter of 09/11/20 (from the past 24 hour(s))  POC Urinalysis dipstick     Status: Abnormal   Collection Time: 09/11/20  4:48 PM  Result Value Ref Range   Glucose, UA NEGATIVE NEGATIVE mg/dL   Bilirubin Urine NEGATIVE NEGATIVE   Ketones, ur TRACE (A) NEGATIVE mg/dL   Specific Gravity, Urine 1.025 1.005 - 1.030   Hgb urine dipstick NEGATIVE NEGATIVE   pH 5.5 5.0 - 8.0   Protein, ur NEGATIVE NEGATIVE mg/dL   Urobilinogen, UA 0.2 0.0 - 1.0 mg/dL   Nitrite NEGATIVE NEGATIVE   Leukocytes,Ua NEGATIVE NEGATIVE    Assessment and Plan :   PDMP not reviewed this encounter.  1. Right flank pain   2. Cystitis     Patient has reassuring urinalysis.  However, we discussed possibility of renal colic versus musculoskeletal pain as her symptoms can be elicited from certain movements or sneezing, coughing.  We will pursue a urine culture to further evaluate whether she is going into urinary tract infection or not.  Recommended she hydrate with at least 64 ounces of water.  To address possible renal stone will use tamsulosin.  Offered patient naproxen but she states she will use this at home.  He is tizanidine for muscle relaxant properties.  Have her follow-up with her PCP to decide on whether or not they should pursue imaging such as a CT renal stone study versus KUB. Counseled patient on potential for adverse effects with medications prescribed/recommended today, ER and return-to-clinic precautions discussed, patient verbalized understanding.    Jaynee Eagles, PA-C 09/11/20 1730

## 2020-09-11 NOTE — ED Triage Notes (Signed)
Pt present right side flank pain, symptom started yesterday. Pt is recently on antibiotics for a UTI.  Pt states that fine now it just the right side flank pain

## 2020-09-12 LAB — URINE CULTURE: Culture: NO GROWTH

## 2020-11-23 ENCOUNTER — Ambulatory Visit: Payer: 59

## 2020-12-21 ENCOUNTER — Other Ambulatory Visit (HOSPITAL_COMMUNITY): Payer: Self-pay

## 2020-12-21 ENCOUNTER — Other Ambulatory Visit (HOSPITAL_COMMUNITY)
Admission: RE | Admit: 2020-12-21 | Discharge: 2020-12-21 | Disposition: A | Discharge status: Home / Self Care | Payer: 59 | Source: Ambulatory Visit | Attending: Internal Medicine | Admitting: Internal Medicine

## 2020-12-21 ENCOUNTER — Ambulatory Visit
Admission: RE | Admit: 2020-12-21 | Discharge: 2020-12-21 | Disposition: A | Discharge status: Home / Self Care | Payer: 59 | Source: Ambulatory Visit | Attending: Internal Medicine | Admitting: Internal Medicine

## 2020-12-21 ENCOUNTER — Ambulatory Visit (INDEPENDENT_AMBULATORY_CARE_PROVIDER_SITE_OTHER): Payer: 59 | Admitting: Internal Medicine

## 2020-12-21 ENCOUNTER — Encounter: Payer: Self-pay | Admitting: Internal Medicine

## 2020-12-21 ENCOUNTER — Other Ambulatory Visit: Payer: Self-pay

## 2020-12-21 VITALS — BP 118/80 | HR 72 | Temp 98.5°F | Resp 18 | Ht 64.0 in | Wt 130.0 lb

## 2020-12-21 DIAGNOSIS — Z0000 Encounter for general adult medical examination without abnormal findings: Secondary | ICD-10-CM | POA: Diagnosis not present

## 2020-12-21 DIAGNOSIS — Z1231 Encounter for screening mammogram for malignant neoplasm of breast: Secondary | ICD-10-CM | POA: Diagnosis not present

## 2020-12-21 DIAGNOSIS — R7301 Impaired fasting glucose: Secondary | ICD-10-CM

## 2020-12-21 DIAGNOSIS — F5101 Primary insomnia: Secondary | ICD-10-CM

## 2020-12-21 LAB — VITAMIN D 25 HYDROXY (VIT D DEFICIENCY, FRACTURES): VITD: 23.47 ng/mL — ABNORMAL LOW (ref 30.00–100.00)

## 2020-12-21 LAB — COMPREHENSIVE METABOLIC PANEL
ALT: 16 U/L (ref 0–35)
AST: 19 U/L (ref 0–37)
Albumin: 4.1 g/dL (ref 3.5–5.2)
Alkaline Phosphatase: 39 U/L (ref 39–117)
BUN: 17 mg/dL (ref 6–23)
CO2: 29 mEq/L (ref 19–32)
Calcium: 9.1 mg/dL (ref 8.4–10.5)
Chloride: 104 mEq/L (ref 96–112)
Creatinine, Ser: 0.66 mg/dL (ref 0.40–1.20)
GFR: 101.55 mL/min (ref >60.00)
Glucose, Bld: 105 mg/dL — ABNORMAL HIGH (ref 70–99)
Potassium: 3.9 mEq/L (ref 3.5–5.1)
Sodium: 139 mEq/L (ref 135–145)
Total Bilirubin: 0.4 mg/dL (ref 0.2–1.2)
Total Protein: 7 g/dL (ref 6.0–8.3)

## 2020-12-21 LAB — CBC
HCT: 40.9 % (ref 36.0–46.0)
Hemoglobin: 13.5 g/dL (ref 12.0–15.0)
MCHC: 33 g/dL (ref 30.0–36.0)
MCV: 95.2 fl (ref 78.0–100.0)
Platelets: 206 10*3/uL (ref 150.0–400.0)
RBC: 4.3 Mil/uL (ref 3.87–5.11)
RDW: 12.9 % (ref 11.5–15.5)
WBC: 3 10*3/uL — ABNORMAL LOW (ref 4.0–10.5)

## 2020-12-21 LAB — LIPID PANEL
Cholesterol: 154 mg/dL (ref 0–200)
HDL: 58.9 mg/dL (ref >39.00)
LDL Cholesterol: 88 mg/dL (ref 0–99)
NonHDL: 95.45
Total CHOL/HDL Ratio: 3
Triglycerides: 37 mg/dL (ref 0.0–149.0)
VLDL: 7.4 mg/dL (ref 0.0–40.0)

## 2020-12-21 LAB — HEMOGLOBIN A1C: Hgb A1c MFr Bld: 5.9 % (ref 4.6–6.5)

## 2020-12-21 LAB — FERRITIN: Ferritin: 12.1 ng/mL (ref 10.0–291.0)

## 2020-12-21 MED ORDER — ZOLPIDEM TARTRATE 10 MG PO TABS
10.0000 mg | ORAL_TABLET | Freq: Every evening | ORAL | 1 refills | Status: DC | PRN
  Filled 2020-12-21: qty 90, 90d supply, fill #0

## 2020-12-21 MED ORDER — CLOTRIMAZOLE-BETAMETHASONE 1-0.05 % EX CREA
1.0000 "application " | TOPICAL_CREAM | Freq: Every day | CUTANEOUS | 0 refills | Status: AC
  Filled 2020-12-21: qty 90, 90d supply, fill #0
  Filled 2020-12-27: qty 45, 30d supply, fill #0

## 2020-12-21 NOTE — Patient Instructions (Signed)
We have done the pap smear and you will get those results as well as the labs.  Make sure to get the covid-19 booster this fall.

## 2020-12-21 NOTE — Assessment & Plan Note (Signed)
Checking HgA1c and adjust as needed.  

## 2020-12-21 NOTE — Assessment & Plan Note (Signed)
Refilled [place] which she uses rarely.

## 2020-12-21 NOTE — Assessment & Plan Note (Signed)
Flu shot yearly. Covid-19 plans to get booster this fall, 3 shots. Shingrix complete. Tetanus up to date. Colonoscopy up to date. Mammogram up to date getting later today, pap smear collected today. Counseled about sun safety and mole surveillance. Counseled about the dangers of distracted driving. Given 10 year screening recommendations.

## 2020-12-21 NOTE — Progress Notes (Signed)
   Subjective:   Patient ID: Shirley Tucker, female    DOB: Feb 17, 1971, 50 y.o.   MRN: [id number]  HPI The patient is a 50 YO female coming in for physical.   PMH, [place], social history reviewed and updated  Review of Systems  Constitutional: Negative.   HENT: Negative.    Eyes: Negative.   Respiratory:  Negative for cough, chest tightness and shortness of breath.   Cardiovascular:  Negative for chest pain, palpitations and leg swelling.  Gastrointestinal:  Negative for abdominal distention, abdominal pain, constipation, diarrhea, nausea and vomiting.  Musculoskeletal: Negative.   Skin: Negative.   Neurological: Negative.   Psychiatric/Behavioral: Negative.     Objective:  Physical Exam Constitutional:      Appearance: She is well-developed.  HENT:     Head: Normocephalic and atraumatic.  Cardiovascular:     Rate and Rhythm: Normal rate and regular rhythm.  Pulmonary:     Effort: Pulmonary effort is normal. No respiratory distress.     Breath sounds: Normal breath sounds. No wheezing or rales.  Abdominal:     General: Bowel sounds are normal. There is no distension.     Palpations: Abdomen is soft.     Tenderness: There is no abdominal tenderness. There is no rebound.  Genitourinary:    General: Normal vulva.     Vagina: No vaginal discharge.     Comments: Pap smear collected Musculoskeletal:     Cervical back: Normal range of motion.  Skin:    General: Skin is warm and dry.  Neurological:     Mental Status: She is alert and oriented to person, place, and time.     Coordination: Coordination normal.    Vitals:   12/21/20 0832  BP: 118/80  Pulse: 72  Resp: 18  Temp: 98.5 F (36.9 C)  TempSrc: Oral  SpO2: 97%  Weight: 130 lb (59 kg)  Height: '5\' 4"'$  (1.626 m)    This visit occurred during the SARS-CoV-2 public health emergency.  Safety protocols were in place, including screening questions prior to the visit, additional usage of staff PPE, and extensive cleaning of  exam room while observing appropriate contact time as indicated for disinfecting solutions.   Assessment & Plan:

## 2020-12-23 LAB — CYTOLOGY - PAP
Adequacy: ABSENT
Comment: NEGATIVE
Diagnosis: NEGATIVE
High risk HPV: NEGATIVE

## 2020-12-27 ENCOUNTER — Other Ambulatory Visit (HOSPITAL_COMMUNITY): Payer: Self-pay

## 2021-05-31 ENCOUNTER — Other Ambulatory Visit (HOSPITAL_COMMUNITY): Payer: Self-pay

## 2021-05-31 MED ORDER — CARESTART COVID-19 HOME TEST VI KIT
PACK | 0 refills | Status: DC
  Filled 2021-05-31: qty 4, 4d supply, fill #0

## 2021-10-26 DIAGNOSIS — M5451 Vertebrogenic low back pain: Secondary | ICD-10-CM | POA: Diagnosis not present

## 2021-11-04 DIAGNOSIS — M545 Low back pain, unspecified: Secondary | ICD-10-CM | POA: Diagnosis not present

## 2021-11-07 ENCOUNTER — Other Ambulatory Visit: Payer: Self-pay | Admitting: Hematology and Oncology

## 2021-11-07 ENCOUNTER — Other Ambulatory Visit: Payer: Self-pay | Admitting: Internal Medicine

## 2021-11-07 DIAGNOSIS — Z1231 Encounter for screening mammogram for malignant neoplasm of breast: Secondary | ICD-10-CM

## 2021-11-07 DIAGNOSIS — M545 Low back pain, unspecified: Secondary | ICD-10-CM

## 2021-11-08 ENCOUNTER — Encounter: Payer: Self-pay | Admitting: Physical Therapy

## 2021-11-08 ENCOUNTER — Encounter: Payer: 59 | Attending: Hematology and Oncology | Admitting: Physical Therapy

## 2021-11-08 DIAGNOSIS — M545 Low back pain, unspecified: Secondary | ICD-10-CM | POA: Diagnosis not present

## 2021-11-08 DIAGNOSIS — M5459 Other low back pain: Secondary | ICD-10-CM | POA: Insufficient documentation

## 2021-11-08 DIAGNOSIS — G8929 Other chronic pain: Secondary | ICD-10-CM | POA: Diagnosis not present

## 2021-11-10 ENCOUNTER — Ambulatory Visit: Payer: 59 | Attending: Hematology and Oncology

## 2021-11-10 DIAGNOSIS — M5459 Other low back pain: Secondary | ICD-10-CM | POA: Diagnosis not present

## 2021-11-10 NOTE — Therapy (Signed)
OUTPATIENT PHYSICAL THERAPY TREATMENT   Patient Name: Shirley Tucker MRN: [id number] DOB:1971/04/19, 51 y.o., female Today's Date: 11/10/2021   PT End of Session - 11/10/21 0803     Visit Number 2    Date for PT Re-Evaluation 01/03/22    Authorization Type UMR    Authorization - Visit Number 2    Authorization - Number of Visits 25    PT Start Time 0731    PT Stop Time 0802    PT Time Calculation (min) 31 min    Activity Tolerance Patient tolerated treatment well    Behavior During Therapy WFL for tasks assessed/performed              Past Medical History:  Diagnosis Date   Anemia    Atypical chest pain    Borderline diabetes    no meds- uses monitor to cjeck PRN- A1C 6 per pt    GERD (gastroesophageal reflux disease)    Heart murmur    asymptomatic    Past Surgical History:  Procedure Laterality Date   KNEE SURGERY Bilateral 1992   WISDOM TOOTH EXTRACTION     almost 25 yrs    Patient Active Problem List   Diagnosis Date Noted   Chronic lower back pain 11/07/2021   Impaired fasting blood sugar 12/10/2018   Routine general medical examination at a health care facility 07/03/2014   Insomnia 07/03/2014    PCP: Hoyt Koch, MD  REFERRING PROVIDER: Heath Lark, MD  REFERRING DIAG: M54.50,G89.29 (ICD-10-CM) - Chronic midline low back pain, unspecified whether sciatica present  Rationale for Evaluation and Treatment Rehabilitation  THERAPY DIAG:  Other low back pain  ONSET DATE: 1 month ago  SUBJECTIVE:                                                                                                                                                                                           SUBJECTIVE STATEMENT: I have been doing the exercises.  Ready to try DN.   PERTINENT HISTORY:  None  PAIN:  Are you having pain? Yes: NPRS scale: 1/10 Pain location: low back Pain description: intermittent Aggravating factors: sit, stand Relieving factors: stand  , laying down on back   PRECAUTIONS: None  WEIGHT BEARING RESTRICTIONS No  FALLS:  Has patient fallen in last 6 months? No  LIVING ENVIRONMENT: Lives with: lives with their family  OCCUPATION: Physician  PLOF: Independent  PATIENT GOALS reduce pain so she can function   OBJECTIVE:   DIAGNOSTIC FINDINGS:  MRI  showed a bulging disc centrally  PATIENT SURVEYS:  FOTO score is 58 predict is 76  SCREENING FOR RED FLAGS: Bowel or bladder incontinence: Yes: stress incontinence Spinal tumors: No Cauda equina syndrome: No Compression fracture: No Abdominal aneurysm: No  COGNITION:  Overall cognitive status: Within functional limits for tasks assessed     SENSATION: WFL   POSTURE: shift hip to the right due to left hip pain. Reduced lumbar lordosis  PALPATION: Tightness in the left paraspinals.   LUMBAR ROM:   Active  A/PROM  eval  Flexion Decreased by 75% with no motion in the lumbar but most at hips  Extension Decreased by 50%  Right lateral flexion Decreased by 25%  Left lateral flexion Decreased by 25%  Right rotation Decreased by 25%  Left rotation Decreased by 25%   (Blank rows = not tested)  LOWER EXTREMITY ROM:   WFL   LOWER EXTREMITY MMT:    MMT Right eval Left eval  Hip abduction  3+/5   (Blank rows = not tested)  LUMBAR SPECIAL TESTS:  Straight leg raise test: Negative     GAIT: Distance walked: 20 feet Assistive device utilized: None Level of assistance: Complete Independence Comments: no issues    TODAY'S TREATMENT  Date: 11/10/2021 Review of HEP Added: sidelying clam and TA activation PA mobilization to L1-L5 Trigger Point Dry-Needling  Treatment instructions: Expect mild to moderate muscle soreness. S/S of pneumothorax if dry needled over a lung field, and to seek immediate medical attention should they occur. Patient verbalized understanding of these instructions and education.  Patient Consent Given: Yes Education  handout provided: Yes Muscles treated: bil lumbar multifidi, paraspinals and proximal gluteals Treatment response/outcome: Utilized skilled palpation to identify trigger points.  During dry needling able to palpate muscle twitch and muscle elongation  Elongation after needling  Skilled palpation and monitoring by PT during dry needling  Date: 11/08/2021 HEP established-see below  PA mobilization to L1-L5 Instruction on flexing at hips instead of spine with activity including sit to stand, squat  Instruction on how to distract the spine in sitting and standing to reduce pain   PATIENT EDUCATION:  Education details: Access Code: [email or web address], body mechanics (handout provided) Person educated: Patient Education method: Explanation, Demonstration, Tactile cues, Verbal cues, and Handouts Education comprehension: verbalized understanding, returned demonstration, verbal cues required, tactile cues required, and needs further education   HOME EXERCISE PROGRAM: 11/08/2021 Access Code: [facility] URL: [email or web address] Date: 11/08/2021 Prepared by: Earlie Counts  Program Notes every 2 hours pick a position to extend your spine  Exercises - Prone Press Up  - 1 x daily - 7 x weekly - 1 sets - 10 reps - Standing Lumbar Extension  - 1 x daily - 7 x weekly - 1 sets - 10 reps  ASSESSMENT:  CLINICAL IMPRESSION: First time follow-up after evaluation.  PT reviewed  HEP, added neutral core strength and provided body mechanics education for lifting and alignment for work.  Pt with tension and trigger points in bil low back and had good response to DN with twitch response and improved tissue mobility after manual therapy.  Patient will benefit from skilled therapy to reduce lumbar pain and understand hwo to manage long term if the pain is to come back.    OBJECTIVE IMPAIRMENTS decreased activity tolerance, decreased coordination, decreased ROM, decreased strength, increased fascial  restrictions, and pain.   ACTIVITY LIMITATIONS carrying, lifting, bending, sitting, and standing  PARTICIPATION LIMITATIONS: cleaning and occupation  PERSONAL FACTORS  none  are also affecting patient's functional outcome.   REHAB POTENTIAL: Excellent  CLINICAL DECISION MAKING: Stable/uncomplicated  EVALUATION COMPLEXITY: Low   GOALS: Goals reviewed with patient? Yes  SHORT TERM GOALS: Target date: 12/06/2021  Patient instructed on correct body mechanics to reduce strain on her back.  Baseline: Goal status: INITIAL  2.  Patient reports her pain decreased >/= 25% due to improved lumbar movement.  Baseline:  Goal status: INITIAL   LONG TERM GOALS: Target date: 01/03/2022  Patient is independent with advanced HEP for core and back strength.  Baseline:  Goal status: INITIAL  2.  Patient reports no to minimal back pain with standing and sitting due to reduction of disc placement.  Baseline:  Goal status: INITIAL  3.  Patient is able to go from sit to stand after sitting for 45 minutes without pain.  Baseline:  Goal status: INITIAL  4.  FOTO score >/= 78 due to reduction of pain and improved mobility.  Baseline:  Goal status: INITIAL     PLAN: PT FREQUENCY: 1-2x/week  PT DURATION: 8 weeks  PLANNED INTERVENTIONS: Therapeutic exercises, Therapeutic activity, Neuromuscular re-education, Patient/Family education, Joint mobilization, Dry Needling, Electrical stimulation, Spinal mobilization, Cryotherapy, Moist heat, Taping, Traction, Ultrasound, and Manual therapy.  PLAN FOR NEXT SESSION: assess response to DN, add gluteal and lumbar flexibility, review core strength   Sigurd Sos, PT 11/10/21 8:04 AM   [facility] [address], [place], [place] [address] Phone # [phone] Fax [phone]

## 2021-11-10 NOTE — Patient Instructions (Signed)
.Trigger Point Dry Needling  What is Trigger Point Dry Needling (DN)? DN is a physical therapy technique used to treat muscle pain and dysfunction. Specifically, DN helps deactivate muscle trigger points (muscle knots).  A thin filiform needle is used to penetrate the skin and stimulate the underlying trigger point. The goal is for a local twitch response (LTR) to occur and for the trigger point to relax. No medication of any kind is injected during the procedure.   What Does Trigger Point Dry Needling Feel Like?  The procedure feels different for each individual patient. Some patients report that they do not actually feel the needle enter the skin and overall the process is not painful. Very mild bleeding may occur. However, many patients feel a deep cramping in the muscle in which the needle was inserted. This is the local twitch response.   How Will I feel after the treatment? Soreness is normal, and the onset of soreness may not occur for a few hours. Typically this soreness does not last longer than two days.  Bruising is uncommon, however; ice can be used to decrease any possible bruising.  In rare cases feeling tired or nauseous after the treatment is normal. In addition, your symptoms may get worse before they get better, this period will typically not last longer than 24 hours.   What Can I do After My Treatment? Increase your hydration by drinking more water for the next 24 hours. You may place ice or heat on the areas treated that have become sore, however, do not use heat on inflamed or bruised areas. Heat often brings more relief post needling. You can continue your regular activities, but vigorous activity is not recommended initially after the treatment for 24 hours. DN is best combined with other physical therapy such as strengthening, stretching, and other therapies.     Lifting Principles  Maintain proper posture and head alignment. Slide object as close as possible before  lifting. Move obstacles out of the way. Test before lifting; ask for help if too heavy. Tighten stomach muscles without holding breath. Use smooth movements; do not jerk. Use legs to do the work, and pivot with feet. Distribute the work load symmetrically and close to the center of trunk. Push instead of pull whenever possible.   Squat down and hold basket close to stand. Use leg muscles to do the work.    Avoid twisting or bending back. Pivot around using foot movements, and bend at knees if needed when reaching for articles.        Getting Into / Out of Bed   Lower self to lie down on one side by raising legs and lowering head at the same time. Use arms to assist moving without twisting. Bend both knees to roll onto back if desired. To sit up, start from lying on side, and use same move-ments in reverse. Keep trunk aligned with legs.    Shift weight from front foot to back foot as item is lifted off shelf.    When leaning forward to pick object up from floor, extend one leg out behind. Keep back straight. Hold onto a sturdy support with other hand.      Sit upright, head facing forward. Try using a roll to support lower back. Keep shoulders relaxed, and avoid rounded back. Keep hips level with knees. Avoid crossing legs for long periods.     [place]  [address] [place] [address].  [phone]

## 2021-11-27 ENCOUNTER — Encounter: Payer: Self-pay | Admitting: Internal Medicine

## 2021-11-28 ENCOUNTER — Other Ambulatory Visit (HOSPITAL_COMMUNITY): Payer: Self-pay

## 2021-11-28 MED ORDER — NITROFURANTOIN MONOHYD MACRO 100 MG PO CAPS
100.0000 mg | ORAL_CAPSULE | Freq: Two times a day (BID) | ORAL | 0 refills | Status: DC
  Filled 2021-11-28: qty 14, 7d supply, fill #0

## 2021-11-29 ENCOUNTER — Ambulatory Visit: Payer: 59 | Attending: Hematology and Oncology

## 2021-11-29 DIAGNOSIS — M5459 Other low back pain: Secondary | ICD-10-CM | POA: Insufficient documentation

## 2021-11-29 NOTE — Therapy (Addendum)
OUTPATIENT PHYSICAL THERAPY TREATMENT   Patient Name: Shirley Tucker MRN: [id number] DOB:1971/01/21, 51 y.o., female Today's Date: 11/29/2021   PT End of Session - 11/29/21 0806     Visit Number 3    Date for PT Re-Evaluation 01/03/22    Authorization Type U[employer]    Authorization - Visit Number 3    Authorization - Number of Visits 25    PT Start Time 0730    PT Stop Time 0805    PT Time Calculation (min) 35 min    Activity Tolerance Patient tolerated treatment well    Behavior During Therapy WFL for tasks assessed/performed               Past Medical History:  Diagnosis Date   Anemia    Atypical chest pain    Borderline diabetes    no meds- uses monitor to cjeck PRN- A1C 6 per pt    GERD (gastroesophageal reflux disease)    Heart murmur    asymptomatic    Past Surgical History:  Procedure Laterality Date   KNEE SURGERY Bilateral 1992   WISDOM TOOTH EXTRACTION     almost 25 yrs    Patient Active Problem List   Diagnosis Date Noted   Chronic lower back pain 11/07/2021   Impaired fasting blood sugar 12/10/2018   Routine general medical examination at a health care facility 07/03/2014   Insomnia 07/03/2014    PCP: Hoyt Koch, MD  REFERRING PROVIDER: Heath Lark, MD  REFERRING DIAG: M54.50,G89.29 (ICD-10-CM) - Chronic midline low back pain, unspecified whether sciatica present  Rationale for Evaluation and Treatment Rehabilitation  THERAPY DIAG:  Other low back pain  ONSET DATE: 1 month ago  SUBJECTIVE:                                                                                                                                                                                           SUBJECTIVE STATEMENT: I'm 80-90% better.  I'm doing well with my exercises.  Max pain this week has been 2/10  PERTINENT HISTORY:  None  PAIN:  Are you having pain? Yes: NPRS scale: 0/10 Pain location: low back Pain description: intermittent Aggravating factors:  sit, stand Relieving factors: stand , laying down on back   PRECAUTIONS: None  WEIGHT BEARING RESTRICTIONS No  FALLS:  Has patient fallen in last 6 months? No  LIVING ENVIRONMENT: Lives with: lives with their family  OCCUPATION: Physician  PLOF: Independent  PATIENT GOALS reduce pain so she can function   OBJECTIVE:   DIAGNOSTIC FINDINGS:  MRI  showed a bulging disc centrally  PATIENT SURVEYS:  FOTO  score is 58 predict is 76  SCREENING FOR RED FLAGS: Bowel or bladder incontinence: Yes: stress incontinence Spinal tumors: No Cauda equina syndrome: No Compression fracture: No Abdominal aneurysm: No  COGNITION:  Overall cognitive status: Within functional limits for tasks assessed     SENSATION: WFL   POSTURE: shift hip to the right due to left hip pain. Reduced lumbar lordosis  PALPATION: Tightness in the left paraspinals.   LUMBAR ROM:   Active  A/PROM  eval  Flexion Decreased by 75% with no motion in the lumbar but most at hips  Extension Decreased by 50%  Right lateral flexion Decreased by 25%  Left lateral flexion Decreased by 25%  Right rotation Decreased by 25%  Left rotation Decreased by 25%   (Blank rows = not tested)  LOWER EXTREMITY ROM:   WFL   LOWER EXTREMITY MMT:    MMT Right eval Left eval  Hip abduction  3+/5   (Blank rows = not tested)  LUMBAR SPECIAL TESTS:  Straight leg raise test: Negative     GAIT: Distance walked: 20 feet Assistive device utilized: None Level of assistance: Complete Independence Comments: no issues    TODAY'S TREATMENT  Date: 11/29/2021 Sidelying clam: x15- tactile cues for alignment and to reduce trunk rocking Supine butterfly 3x20 seconds  Seated figure 4 3x20 seconds  Quadruped hip rotation x 10 each  Trigger Point Dry-Needling  Treatment instructions: Expect mild to moderate muscle soreness. S/S of pneumothorax if dry needled over a lung field, and to seek immediate medical attention  should they occur. Patient verbalized understanding of these instructions and education.  Patient Consent Given: Yes Education handout provided: Yes Muscles treated: bil lumbar multifidi, paraspinals and proximal gluteals Treatment response/outcome: Utilized skilled palpation to identify trigger points.  During dry needling able to palpate muscle twitch and muscle elongation  Elongation after needling  Skilled palpation and monitoring by PT during dry needling  Date: 11/10/2021 Review of HEP Added: sidelying clam and TA activation PA mobilization to L1-L5 Trigger Point Dry-Needling  Treatment instructions: Expect mild to moderate muscle soreness. S/S of pneumothorax if dry needled over a lung field, and to seek immediate medical attention should they occur. Patient verbalized understanding of these instructions and education.  Patient Consent Given: Yes Education handout provided: Yes Muscles treated: bil lumbar multifidi, paraspinals and proximal gluteals Treatment response/outcome: Utilized skilled palpation to identify trigger points.  During dry needling able to palpate muscle twitch and muscle elongation  Elongation after needling  Skilled palpation and monitoring by PT during dry needling  Date: 11/08/2021 HEP established-see below  PA mobilization to L1-L5 Instruction on flexing at hips instead of spine with activity including sit to stand, squat  Instruction on how to distract the spine in sitting and standing to reduce pain   PATIENT EDUCATION:  Education details: Access Code: [email or web address], body mechanics (handout provided) Person educated: Patient Education method: Explanation, Demonstration, Tactile cues, Verbal cues, and Handouts Education comprehension: verbalized understanding, returned demonstration, verbal cues required, tactile cues required, and needs further education   HOME EXERCISE PROGRAM: 6Access Code: [email or web address] URL: [email or web address] Date:  11/29/2021 Prepared by: Claiborne Billings  Program Notes every 2 hours pick a position to extend your spine  Exercises - Prone Press Up  - 1 x daily - 7 x weekly - 1 sets - 10 reps - Standing Lumbar Extension  - 1 x daily - 7 x weekly - 1 sets - 10 reps - Clamshell  - 2 x daily - 7  x weekly - 1-2 sets - 10 reps - Hooklying Transversus Abdominis Palpation  - 5 x daily - 7 x weekly - 1 sets - 10 reps - Seated Transversus Abdominis Bracing  - 5 x daily - 7 x weekly - 1 sets - 10 reps - Supine Butterfly Groin Stretch  - 3 x daily - 7 x weekly - 1 sets - 3 reps - 20 hold - Seated Piriformis Stretch with Trunk Bend  - 2 x daily - 7 x weekly - 1 sets - 3 reps - 20 hold ASSESSMENT:  CLINICAL IMPRESSION: Pt reports 80-90% overall improvement in LBP since the start of care.  Pt required tactile and verbal cues for technique with clamshells.  Pt with reduced muscle tension overall and had good response to DN today with twitch Rt>Lt.   Patient will benefit from skilled therapy to reduce lumbar pain and understand hwo to manage long term if the pain is to come back.    OBJECTIVE IMPAIRMENTS decreased activity tolerance, decreased coordination, decreased ROM, decreased strength, increased fascial restrictions, and pain.   ACTIVITY LIMITATIONS carrying, lifting, bending, sitting, and standing  PARTICIPATION LIMITATIONS: cleaning and occupation  PERSONAL FACTORS  none  are also affecting patient's functional outcome.   REHAB POTENTIAL: Excellent  CLINICAL DECISION MAKING: Stable/uncomplicated  EVALUATION COMPLEXITY: Low   GOALS: Goals reviewed with patient? Yes  SHORT TERM GOALS: Target date: 12/06/2021  Patient instructed on correct body mechanics to reduce strain on her back.  Baseline: Goal status: INITIAL  2.  Patient reports her pain decreased >/= 25% due to improved lumbar movement.  Baseline:  Goal status: met    LONG TERM GOALS: Target date: 01/03/2022  Patient is independent with  advanced HEP for core and back strength.  Baseline:  Goal status: INITIAL  2.  Patient reports no to minimal back pain with standing and sitting due to reduction of disc placement.  Baseline: 0-2/10 (11/29/21) Goal status: MET  3.  Patient is able to go from sit to stand after sitting for 45 minutes without pain.  Baseline:  Goal status: INITIAL  4.  FOTO score >/= 78 due to reduction of pain and improved mobility.  Baseline:  Goal status: INITIAL     PLAN: PT FREQUENCY: 1-2x/week  PT DURATION: 8 weeks  PLANNED INTERVENTIONS: Therapeutic exercises, Therapeutic activity, Neuromuscular re-education, Patient/Family education, Joint mobilization, Dry Needling, Electrical stimulation, Spinal mobilization, Cryotherapy, Moist heat, Taping, Traction, Ultrasound, and Manual therapy.  PLAN FOR NEXT SESSION: 1 more session probable.  Pt might cancel or ask to be placed on hold.    Sigurd Sos, PT 11/29/21 8:10 AM  PHYSICAL THERAPY DISCHARGE SUMMARY  Visits from Start of Care: 3  Current functional level related to goals / functional outcomes: See above for most current PT status.     Remaining deficits: See above    Education / Equipment: HEP   Patient agrees to discharge. Patient goals were partially met. Patient is being discharged due to being pleased with the current functional level. Sigurd Sos, PT 01/12/22 9:06 AM   [facility] [address], [place], [place] [address] Phone # [phone] Fax [phone]

## 2021-12-27 ENCOUNTER — Encounter: Payer: Self-pay | Admitting: Internal Medicine

## 2021-12-27 ENCOUNTER — Other Ambulatory Visit (HOSPITAL_COMMUNITY): Payer: Self-pay

## 2021-12-27 ENCOUNTER — Ambulatory Visit
Admission: RE | Admit: 2021-12-27 | Discharge: 2021-12-27 | Disposition: A | Discharge status: Home / Self Care | Payer: 59 | Source: Ambulatory Visit | Attending: Internal Medicine | Admitting: Internal Medicine

## 2021-12-27 ENCOUNTER — Ambulatory Visit (INDEPENDENT_AMBULATORY_CARE_PROVIDER_SITE_OTHER): Payer: 59 | Admitting: Internal Medicine

## 2021-12-27 VITALS — BP 102/78 | HR 86 | Temp 98.0°F | Ht 64.0 in | Wt 130.0 lb

## 2021-12-27 DIAGNOSIS — Z0000 Encounter for general adult medical examination without abnormal findings: Secondary | ICD-10-CM | POA: Diagnosis not present

## 2021-12-27 DIAGNOSIS — Z1231 Encounter for screening mammogram for malignant neoplasm of breast: Secondary | ICD-10-CM | POA: Diagnosis not present

## 2021-12-27 DIAGNOSIS — F5101 Primary insomnia: Secondary | ICD-10-CM

## 2021-12-27 DIAGNOSIS — R7301 Impaired fasting glucose: Secondary | ICD-10-CM | POA: Diagnosis not present

## 2021-12-27 MED ORDER — NITROFURANTOIN MONOHYD MACRO 100 MG PO CAPS
100.0000 mg | ORAL_CAPSULE | ORAL | 2 refills | Status: DC | PRN
  Filled 2021-12-27: qty 30, 30d supply, fill #0

## 2021-12-27 MED ORDER — TRUE METRIX BLOOD GLUCOSE TEST VI STRP
ORAL_STRIP | 12 refills | Status: DC
  Filled 2021-12-27 – 2022-01-03 (×2): qty 100, 90d supply, fill #0

## 2021-12-27 NOTE — Progress Notes (Signed)
   Subjective:   Patient ID: Shirley Tucker, female    DOB: 1971-03-31, 51 y.o.   MRN: [id number]  HPI The patient is here for physical.  PMH, [facility], social history reviewed and updated  Review of Systems  Constitutional: Negative.   HENT: Negative.    Eyes: Negative.   Respiratory:  Negative for cough, chest tightness and shortness of breath.   Cardiovascular:  Negative for chest pain, palpitations and leg swelling.  Gastrointestinal:  Negative for abdominal distention, abdominal pain, constipation, diarrhea, nausea and vomiting.  Musculoskeletal: Negative.   Skin: Negative.   Neurological: Negative.   Psychiatric/Behavioral: Negative.      Objective:  Physical Exam Constitutional:      Appearance: She is well-developed.  HENT:     Head: Normocephalic and atraumatic.  Cardiovascular:     Rate and Rhythm: Normal rate and regular rhythm.  Pulmonary:     Effort: Pulmonary effort is normal. No respiratory distress.     Breath sounds: Normal breath sounds. No wheezing or rales.  Abdominal:     General: Bowel sounds are normal. There is no distension.     Palpations: Abdomen is soft.     Tenderness: There is no abdominal tenderness. There is no rebound.  Musculoskeletal:     Cervical back: Normal range of motion.  Skin:    General: Skin is warm and dry.  Neurological:     Mental Status: She is alert and oriented to person, place, and time.     Coordination: Coordination normal.     Vitals:   12/27/21 0917  BP: 102/78  Pulse: 86  Temp: 98 F (36.7 C)  TempSrc: Oral  SpO2: 97%  Weight: 130 lb (59 kg)  Height: '5\' 4"'$  (1.626 m)    Assessment & Plan:

## 2021-12-29 ENCOUNTER — Other Ambulatory Visit: Payer: Self-pay | Admitting: Internal Medicine

## 2021-12-29 DIAGNOSIS — R928 Other abnormal and inconclusive findings on diagnostic imaging of breast: Secondary | ICD-10-CM

## 2021-12-30 NOTE — Assessment & Plan Note (Signed)
Uses ambien rarely and does not need refill today.

## 2021-12-30 NOTE — Assessment & Plan Note (Signed)
Checking HgA1c. She will consider metformin if HgA1c higher or still elevated.

## 2021-12-30 NOTE — Assessment & Plan Note (Signed)
Flu shot yearly. Covid-19 counseled. Shingrix complete. Tetanus up to date. Colonoscopy up to date. Mammogram up to date, pap smear up to date. Counseled about sun safety and mole surveillance. Counseled about the dangers of distracted driving. Given 10 year screening recommendations.   

## 2022-01-03 ENCOUNTER — Other Ambulatory Visit (HOSPITAL_COMMUNITY): Payer: Self-pay

## 2022-01-10 ENCOUNTER — Ambulatory Visit
Admission: RE | Admit: 2022-01-10 | Discharge: 2022-01-10 | Disposition: A | Discharge status: Home / Self Care | Payer: 59 | Source: Ambulatory Visit | Attending: Internal Medicine | Admitting: Internal Medicine

## 2022-01-10 DIAGNOSIS — R922 Inconclusive mammogram: Secondary | ICD-10-CM | POA: Diagnosis not present

## 2022-01-10 DIAGNOSIS — R928 Other abnormal and inconclusive findings on diagnostic imaging of breast: Secondary | ICD-10-CM

## 2022-01-10 DIAGNOSIS — N6489 Other specified disorders of breast: Secondary | ICD-10-CM | POA: Diagnosis not present

## 2022-02-16 ENCOUNTER — Other Ambulatory Visit: Payer: Self-pay | Admitting: Internal Medicine

## 2022-02-16 ENCOUNTER — Other Ambulatory Visit (INDEPENDENT_AMBULATORY_CARE_PROVIDER_SITE_OTHER): Payer: 59

## 2022-02-16 DIAGNOSIS — R7301 Impaired fasting glucose: Secondary | ICD-10-CM

## 2022-02-16 DIAGNOSIS — Z0000 Encounter for general adult medical examination without abnormal findings: Secondary | ICD-10-CM

## 2022-02-16 LAB — CBC
HCT: 43.1 % (ref 36.0–46.0)
Hemoglobin: 14.2 g/dL (ref 12.0–15.0)
MCHC: 32.9 g/dL (ref 30.0–36.0)
MCV: 96.2 fl (ref 78.0–100.0)
Platelets: 191 10*3/uL (ref 150.0–400.0)
RBC: 4.48 Mil/uL (ref 3.87–5.11)
RDW: 12.2 % (ref 11.5–15.5)
WBC: 3.8 10*3/uL — ABNORMAL LOW (ref 4.0–10.5)

## 2022-02-16 LAB — COMPREHENSIVE METABOLIC PANEL
ALT: 16 U/L (ref 0–35)
AST: 17 U/L (ref 0–37)
Albumin: 4.3 g/dL (ref 3.5–5.2)
Alkaline Phosphatase: 38 U/L — ABNORMAL LOW (ref 39–117)
BUN: 20 mg/dL (ref 6–23)
CO2: 31 mEq/L (ref 19–32)
Calcium: 9.2 mg/dL (ref 8.4–10.5)
Chloride: 105 mEq/L (ref 96–112)
Creatinine, Ser: 0.74 mg/dL (ref 0.40–1.20)
GFR: 92.91 mL/min (ref >60.00)
Glucose, Bld: 103 mg/dL — ABNORMAL HIGH (ref 70–99)
Potassium: 4.2 mEq/L (ref 3.5–5.1)
Sodium: 141 mEq/L (ref 135–145)
Total Bilirubin: 0.4 mg/dL (ref 0.2–1.2)
Total Protein: 7.3 g/dL (ref 6.0–8.3)

## 2022-02-16 LAB — LIPID PANEL
Cholesterol: 167 mg/dL (ref 0–200)
HDL: 58.9 mg/dL (ref >39.00)
LDL Cholesterol: 99 mg/dL (ref 0–99)
NonHDL: 108.48
Total CHOL/HDL Ratio: 3
Triglycerides: 45 mg/dL (ref 0.0–149.0)
VLDL: 9 mg/dL (ref 0.0–40.0)

## 2022-02-16 LAB — HEMOGLOBIN A1C: Hgb A1c MFr Bld: 6.3 % (ref 4.6–6.5)

## 2022-02-17 ENCOUNTER — Encounter: Payer: Self-pay | Admitting: Internal Medicine

## 2022-04-09 ENCOUNTER — Encounter: Payer: Self-pay | Admitting: Internal Medicine

## 2022-04-09 DIAGNOSIS — R7301 Impaired fasting glucose: Secondary | ICD-10-CM

## 2022-04-10 ENCOUNTER — Other Ambulatory Visit (HOSPITAL_COMMUNITY): Payer: Self-pay

## 2022-04-10 ENCOUNTER — Other Ambulatory Visit (HOSPITAL_BASED_OUTPATIENT_CLINIC_OR_DEPARTMENT_OTHER): Payer: Self-pay

## 2022-04-10 MED ORDER — BLOOD GLUCOSE METER KIT: PACK | 0 refills | Status: DC

## 2022-04-10 MED ORDER — METFORMIN HCL 500 MG PO TABS
500.0000 mg | ORAL_TABLET | Freq: Every day | ORAL | 3 refills | Status: DC
  Filled 2022-04-10: qty 90, 90d supply, fill #0
  Filled 2022-07-19: qty 90, 90d supply, fill #1
  Filled 2022-10-11: qty 90, 90d supply, fill #2

## 2022-04-18 ENCOUNTER — Other Ambulatory Visit (HOSPITAL_COMMUNITY): Payer: Self-pay

## 2022-07-19 ENCOUNTER — Other Ambulatory Visit (HOSPITAL_COMMUNITY): Payer: Self-pay

## 2022-10-11 ENCOUNTER — Other Ambulatory Visit: Payer: Self-pay | Admitting: Internal Medicine

## 2022-10-11 ENCOUNTER — Other Ambulatory Visit (HOSPITAL_COMMUNITY): Payer: Self-pay

## 2022-10-11 MED ORDER — BLOOD GLUCOSE METER KIT: PACK | 0 refills | Status: DC

## 2022-10-12 ENCOUNTER — Other Ambulatory Visit: Payer: Self-pay | Admitting: Internal Medicine

## 2022-10-12 ENCOUNTER — Other Ambulatory Visit (HOSPITAL_COMMUNITY): Payer: Self-pay

## 2022-10-12 ENCOUNTER — Other Ambulatory Visit: Payer: Self-pay

## 2022-10-12 MED ORDER — METFORMIN HCL 500 MG PO TABS
500.0000 mg | ORAL_TABLET | Freq: Every day | ORAL | 0 refills | Status: DC
  Filled 2022-10-12 – 2022-10-19 (×3): qty 90, 90d supply, fill #0

## 2022-10-19 ENCOUNTER — Other Ambulatory Visit (HOSPITAL_COMMUNITY): Payer: Self-pay

## 2022-10-25 ENCOUNTER — Other Ambulatory Visit (HOSPITAL_COMMUNITY): Payer: Self-pay

## 2022-10-25 ENCOUNTER — Encounter: Payer: Self-pay | Admitting: Internal Medicine

## 2022-10-25 ENCOUNTER — Other Ambulatory Visit: Payer: Self-pay

## 2022-10-25 ENCOUNTER — Other Ambulatory Visit: Payer: Self-pay | Admitting: Internal Medicine

## 2022-10-25 ENCOUNTER — Telehealth: Payer: Self-pay | Admitting: Internal Medicine

## 2022-10-25 MED ORDER — BLOOD GLUCOSE METER KIT: PACK | 0 refills | Status: DC

## 2022-10-25 MED ORDER — BLOOD GLUCOSE METER KIT
PACK | 0 refills | Status: AC
  Filled 2022-10-25: qty 1, 30d supply, fill #0

## 2022-10-25 MED ORDER — FREESTYLE LANCETS MISC
12 refills | Status: AC
  Filled 2022-10-25: qty 100, 50d supply, fill #0

## 2022-10-25 MED ORDER — FREESTYLE LITE TEST VI STRP
ORAL_STRIP | 12 refills | Status: AC
  Filled 2022-10-25: qty 100, 50d supply, fill #0
  Filled 2022-11-02: qty 50, 25d supply, fill #0

## 2022-10-25 NOTE — Telephone Encounter (Signed)
Patient would like a new glucose, strips and lancets sent in to [facility].  Please let patient know when this has been sent.  [phone]

## 2022-10-25 NOTE — Telephone Encounter (Signed)
Called pt inform her MD need to know what meter insurance will cover so rx can be sent. Pt will contact insurance and let us know../l,mb

## 2022-10-26 ENCOUNTER — Other Ambulatory Visit (HOSPITAL_COMMUNITY): Payer: Self-pay

## 2022-10-26 ENCOUNTER — Other Ambulatory Visit: Payer: Self-pay

## 2022-10-26 MED ORDER — FREESTYLE LITE TEST VI STRP
ORAL_STRIP | 12 refills | Status: AC
  Filled 2022-10-26: qty 100, fill #0

## 2022-10-26 MED ORDER — FREESTYLE LANCETS MISC
12 refills | Status: AC
  Filled 2022-10-26: qty 100, fill #0

## 2022-10-26 MED ORDER — FREESTYLE LITE W/DEVICE KIT
PACK | 0 refills | Status: AC
  Filled 2022-10-26: qty 1, fill #0

## 2022-11-02 ENCOUNTER — Other Ambulatory Visit (HOSPITAL_COMMUNITY): Payer: Self-pay

## 2022-12-13 ENCOUNTER — Encounter (INDEPENDENT_AMBULATORY_CARE_PROVIDER_SITE_OTHER): Payer: Self-pay

## 2022-12-21 ENCOUNTER — Other Ambulatory Visit (HOSPITAL_COMMUNITY): Payer: Self-pay

## 2022-12-21 ENCOUNTER — Other Ambulatory Visit: Payer: Self-pay | Admitting: Internal Medicine

## 2022-12-22 ENCOUNTER — Other Ambulatory Visit (HOSPITAL_COMMUNITY): Payer: Self-pay

## 2022-12-22 MED ORDER — METFORMIN HCL 500 MG PO TABS
500.0000 mg | ORAL_TABLET | Freq: Every day | ORAL | 0 refills | Status: DC
  Filled 2022-12-22: qty 30, 30d supply, fill #0

## 2022-12-26 ENCOUNTER — Other Ambulatory Visit: Payer: Self-pay | Admitting: Internal Medicine

## 2022-12-26 DIAGNOSIS — Z1231 Encounter for screening mammogram for malignant neoplasm of breast: Secondary | ICD-10-CM

## 2022-12-29 ENCOUNTER — Encounter: Payer: 59 | Admitting: Internal Medicine

## 2022-12-29 ENCOUNTER — Ambulatory Visit
Admission: RE | Admit: 2022-12-29 | Discharge: 2022-12-29 | Disposition: A | Discharge status: Home / Self Care | Payer: 59 | Source: Ambulatory Visit | Attending: Internal Medicine | Admitting: Internal Medicine

## 2022-12-29 DIAGNOSIS — Z1231 Encounter for screening mammogram for malignant neoplasm of breast: Secondary | ICD-10-CM

## 2023-01-02 ENCOUNTER — Encounter: Payer: Self-pay | Admitting: Internal Medicine

## 2023-01-02 LAB — HM MAMMOGRAPHY

## 2023-01-05 ENCOUNTER — Ambulatory Visit (INDEPENDENT_AMBULATORY_CARE_PROVIDER_SITE_OTHER): Payer: 59 | Admitting: Internal Medicine

## 2023-01-05 ENCOUNTER — Other Ambulatory Visit (HOSPITAL_COMMUNITY): Payer: Self-pay

## 2023-01-05 ENCOUNTER — Encounter: Payer: Self-pay | Admitting: Internal Medicine

## 2023-01-05 VITALS — BP 100/80 | HR 74 | Temp 98.4°F | Ht 64.0 in | Wt 128.0 lb

## 2023-01-05 DIAGNOSIS — Z0000 Encounter for general adult medical examination without abnormal findings: Secondary | ICD-10-CM | POA: Diagnosis not present

## 2023-01-05 DIAGNOSIS — R7301 Impaired fasting glucose: Secondary | ICD-10-CM

## 2023-01-05 DIAGNOSIS — F5101 Primary insomnia: Secondary | ICD-10-CM

## 2023-01-05 LAB — CBC
HCT: 43.7 % (ref 36.0–46.0)
Hemoglobin: 14.3 g/dL (ref 12.0–15.0)
MCHC: 32.7 g/dL (ref 30.0–36.0)
MCV: 96.5 fl (ref 78.0–100.0)
Platelets: 234 10*3/uL (ref 150.0–400.0)
RBC: 4.53 Mil/uL (ref 3.87–5.11)
RDW: 12.2 % (ref 11.5–15.5)
WBC: 3.9 10*3/uL — ABNORMAL LOW (ref 4.0–10.5)

## 2023-01-05 LAB — COMPREHENSIVE METABOLIC PANEL
ALT: 21 U/L (ref 0–35)
AST: 22 U/L (ref 0–37)
Albumin: 4.4 g/dL (ref 3.5–5.2)
Alkaline Phosphatase: 45 U/L (ref 39–117)
BUN: 17 mg/dL (ref 6–23)
CO2: 33 mEq/L — ABNORMAL HIGH (ref 19–32)
Calcium: 9.5 mg/dL (ref 8.4–10.5)
Chloride: 103 mEq/L (ref 96–112)
Creatinine, Ser: 0.74 mg/dL (ref 0.40–1.20)
GFR: 92.33 mL/min (ref >60.00)
Glucose, Bld: 111 mg/dL — ABNORMAL HIGH (ref 70–99)
Potassium: 4.4 mEq/L (ref 3.5–5.1)
Sodium: 141 mEq/L (ref 135–145)
Total Bilirubin: 0.5 mg/dL (ref 0.2–1.2)
Total Protein: 7.4 g/dL (ref 6.0–8.3)

## 2023-01-05 LAB — LIPID PANEL
Cholesterol: 195 mg/dL (ref 0–200)
HDL: 53.9 mg/dL (ref >39.00)
LDL Cholesterol: 129 mg/dL — ABNORMAL HIGH (ref 0–99)
NonHDL: 141.02
Total CHOL/HDL Ratio: 4
Triglycerides: 61 mg/dL (ref 0.0–149.0)
VLDL: 12.2 mg/dL (ref 0.0–40.0)

## 2023-01-05 LAB — FERRITIN: Ferritin: 87.7 ng/mL (ref 10.0–291.0)

## 2023-01-05 LAB — HEMOGLOBIN A1C: Hgb A1c MFr Bld: 6.2 % (ref 4.6–6.5)

## 2023-01-05 MED ORDER — METFORMIN HCL 500 MG PO TABS
ORAL_TABLET | ORAL | 3 refills | Status: DC
  Filled 2023-01-05: qty 90, fill #0

## 2023-01-05 MED ORDER — NITROFURANTOIN MONOHYD MACRO 100 MG PO CAPS
100.0000 mg | ORAL_CAPSULE | ORAL | 2 refills | Status: DC | PRN
  Filled 2023-01-05: qty 30, 30d supply, fill #0

## 2023-01-05 MED ORDER — METFORMIN HCL ER 750 MG PO TB24
750.0000 mg | ORAL_TABLET | Freq: Every day | ORAL | 3 refills | Status: DC
  Filled 2023-01-05 – 2023-04-07 (×2): qty 90, 90d supply, fill #0

## 2023-01-05 NOTE — Assessment & Plan Note (Signed)
Uses ambien 10 mg at bedtime rarely and denies need for refill today. Can refill as needed.

## 2023-01-05 NOTE — Progress Notes (Signed)
   Subjective:   Patient ID: Shirley Tucker, female    DOB: 01/13/1971, 52 y.o.   MRN: [id number]  HPI The patient is here for physical.  PMH, [facility], social history reviewed and updated  Review of Systems  Constitutional: Negative.   HENT: Negative.    Eyes: Negative.   Respiratory:  Negative for cough, chest tightness and shortness of breath.   Cardiovascular:  Negative for chest pain, palpitations and leg swelling.  Gastrointestinal:  Negative for abdominal distention, abdominal pain, constipation, diarrhea, nausea and vomiting.  Musculoskeletal: Negative.   Skin: Negative.   Neurological: Negative.   Psychiatric/Behavioral: Negative.      Objective:  Physical Exam Constitutional:      Appearance: She is well-developed.  HENT:     Head: Normocephalic and atraumatic.  Cardiovascular:     Rate and Rhythm: Normal rate and regular rhythm.  Pulmonary:     Effort: Pulmonary effort is normal. No respiratory distress.     Breath sounds: Normal breath sounds. No wheezing or rales.  Abdominal:     General: Bowel sounds are normal. There is no distension.     Palpations: Abdomen is soft.     Tenderness: There is no abdominal tenderness. There is no rebound.  Musculoskeletal:     Cervical back: Normal range of motion.  Skin:    General: Skin is warm and dry.  Neurological:     Mental Status: She is alert and oriented to person, place, and time.     Coordination: Coordination normal.     Vitals:   01/05/23 0918  BP: 100/80  Pulse: 74  Temp: 98.4 F (36.9 C)  TempSrc: Oral  SpO2: 96%  Weight: 128 lb (58.1 kg)  Height: 5\' 4"  (1.626 m)    Assessment & Plan:

## 2023-01-05 NOTE — Assessment & Plan Note (Signed)
Flu shot yearly. Shingrix complete. Tetanus up to date. Colonoscopy up to date. Mammogram up to date, pap smear up to date. Counseled about sun safety and mole surveillance. Counseled about the dangers of distracted driving. Given 10 year screening recommendations.

## 2023-01-05 NOTE — Assessment & Plan Note (Signed)
Checking HgA1c and change metformin to xl for better tolerance. Rx metformin 750 mg xl daily.

## 2023-01-16 ENCOUNTER — Other Ambulatory Visit (HOSPITAL_COMMUNITY): Payer: Self-pay

## 2023-01-26 DIAGNOSIS — H524 Presbyopia: Secondary | ICD-10-CM | POA: Diagnosis not present

## 2023-01-26 DIAGNOSIS — H02831 Dermatochalasis of right upper eyelid: Secondary | ICD-10-CM | POA: Diagnosis not present

## 2023-01-26 DIAGNOSIS — E119 Type 2 diabetes mellitus without complications: Secondary | ICD-10-CM | POA: Diagnosis not present

## 2023-01-26 DIAGNOSIS — H11153 Pinguecula, bilateral: Secondary | ICD-10-CM | POA: Diagnosis not present

## 2023-01-26 DIAGNOSIS — H02834 Dermatochalasis of left upper eyelid: Secondary | ICD-10-CM | POA: Diagnosis not present

## 2023-01-26 DIAGNOSIS — H1045 Other chronic allergic conjunctivitis: Secondary | ICD-10-CM | POA: Diagnosis not present

## 2023-01-26 DIAGNOSIS — H2513 Age-related nuclear cataract, bilateral: Secondary | ICD-10-CM | POA: Diagnosis not present

## 2023-01-26 LAB — HM DIABETES EYE EXAM

## 2023-01-29 ENCOUNTER — Encounter: Payer: Self-pay | Admitting: Internal Medicine

## 2023-04-07 ENCOUNTER — Other Ambulatory Visit (HOSPITAL_COMMUNITY): Payer: Self-pay

## 2023-05-29 IMAGING — MG MM DIGITAL SCREENING BILAT W/ TOMO AND CAD
8 series · 9 of 24 positions shown · non-contrast
Comparison: Previous exam(s).

CLINICAL DATA: Screening.

EXAM:
DIGITAL SCREENING BILATERAL MAMMOGRAM WITH TOMOSYNTHESIS AND CAD
TECHNIQUE: Bilateral screening digital craniocaudal and mediolateral oblique
mammograms were obtained. Bilateral screening digital breast
tomosynthesis was performed. The images were evaluated with
computer-aided detection.

[L CC synth-2D]
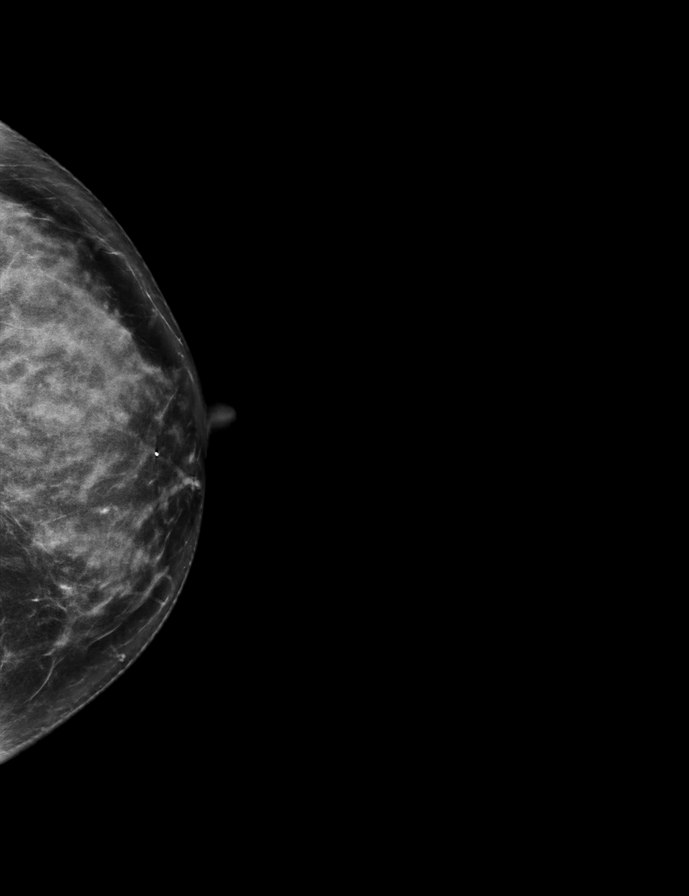

[R MLO synth-2D]
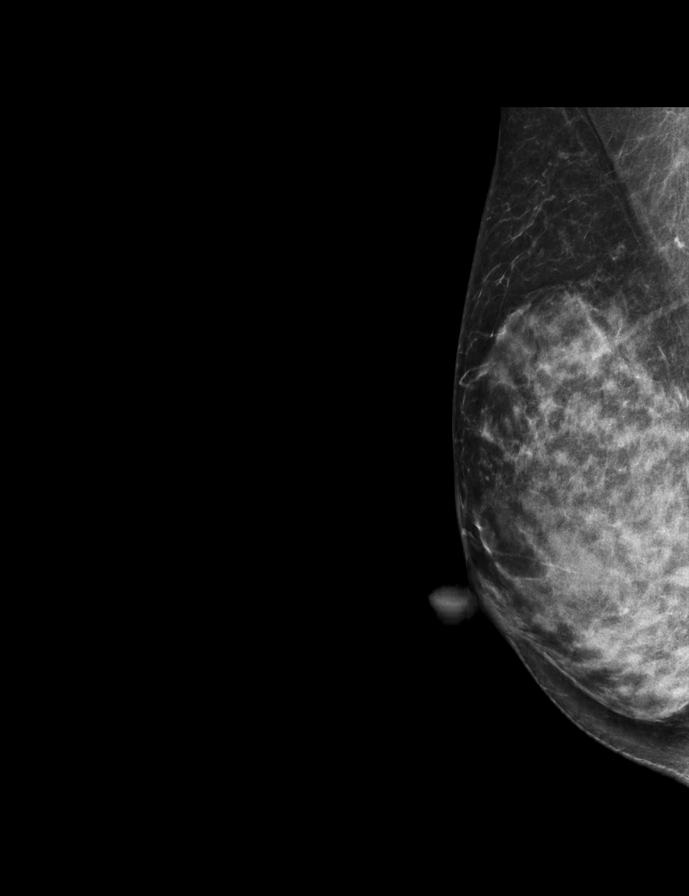

[R CC synth-2D]
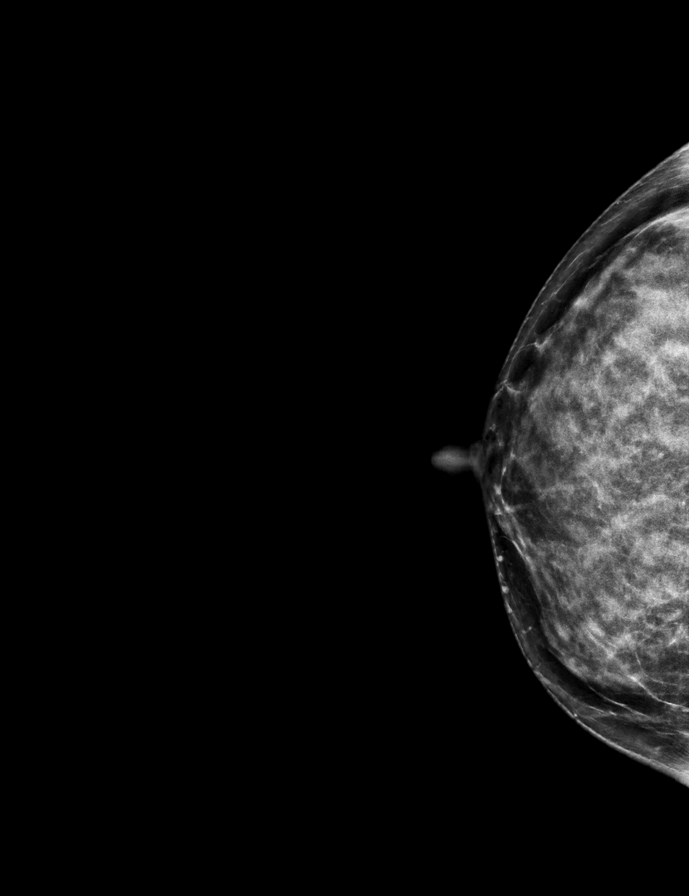

[L MLO synth-2D]
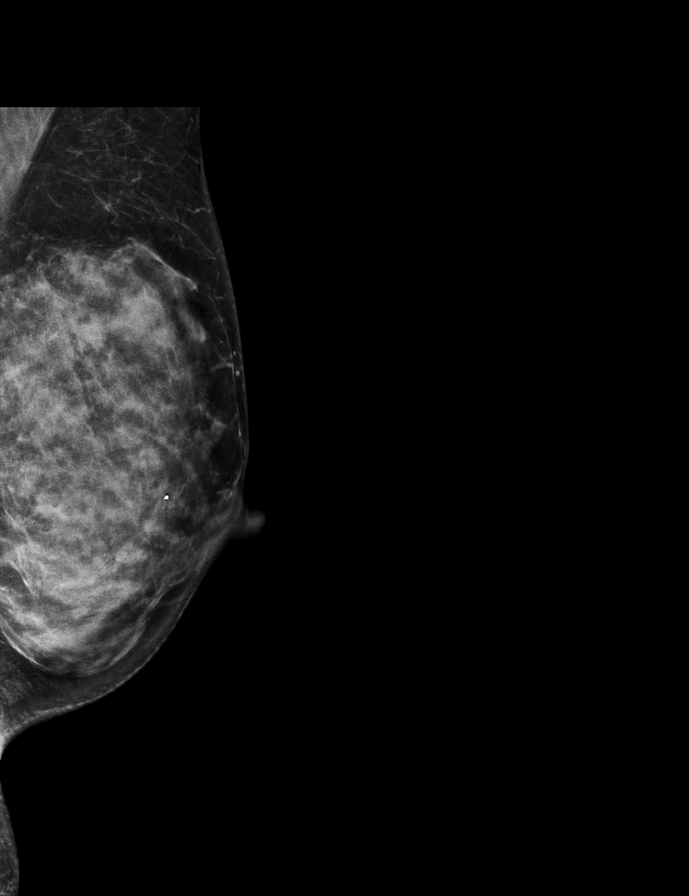

[R MLO tomo · 2 of 66 frames shown]
[frame 22/66]
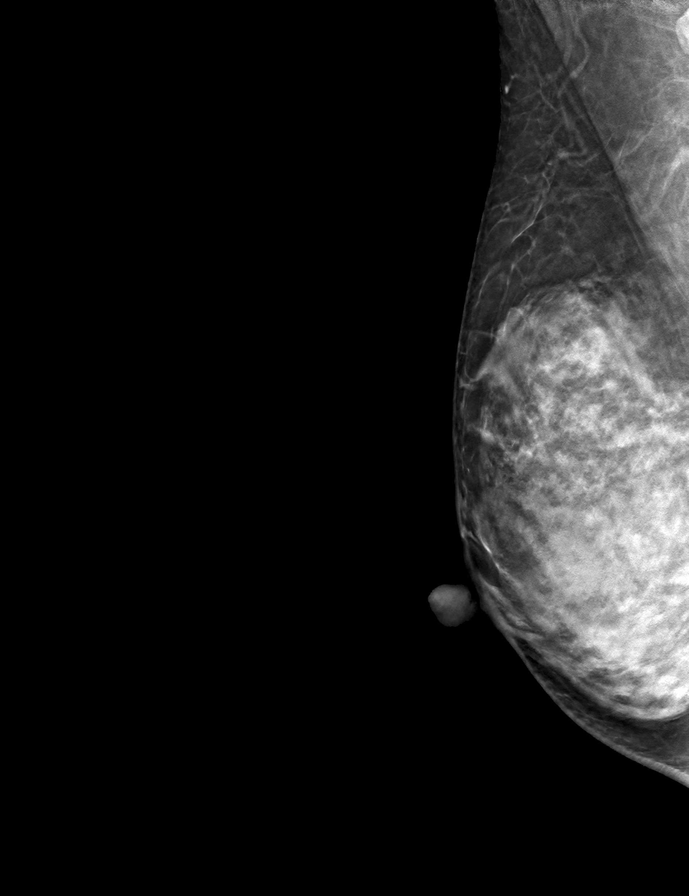
[frame 33/66]
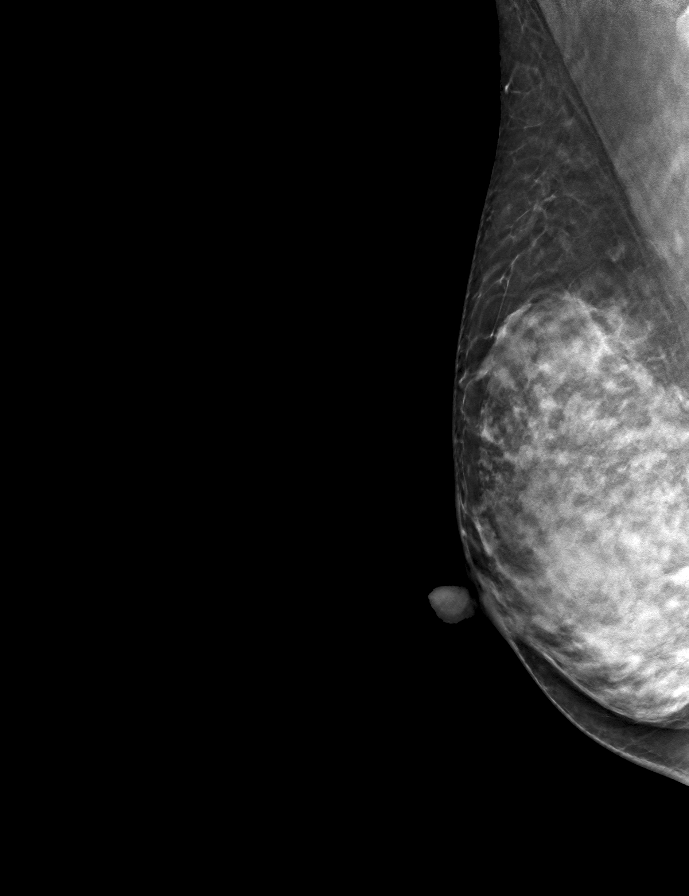

[L MLO tomo · tomo slice 34/67.0]
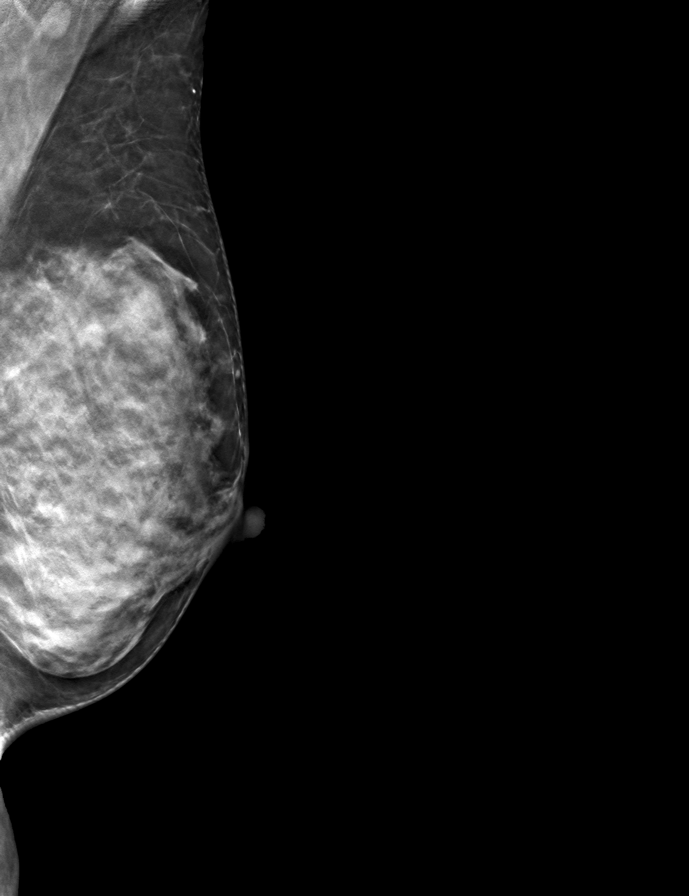

[L CC tomo · tomo slice 35/68.0]
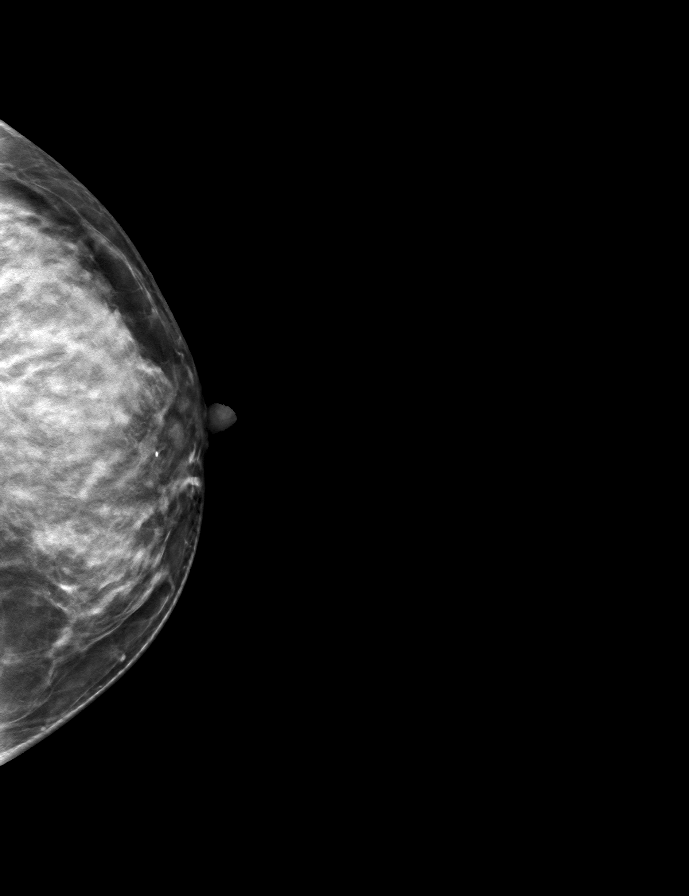

[R CC tomo · tomo slice 31/61.0]
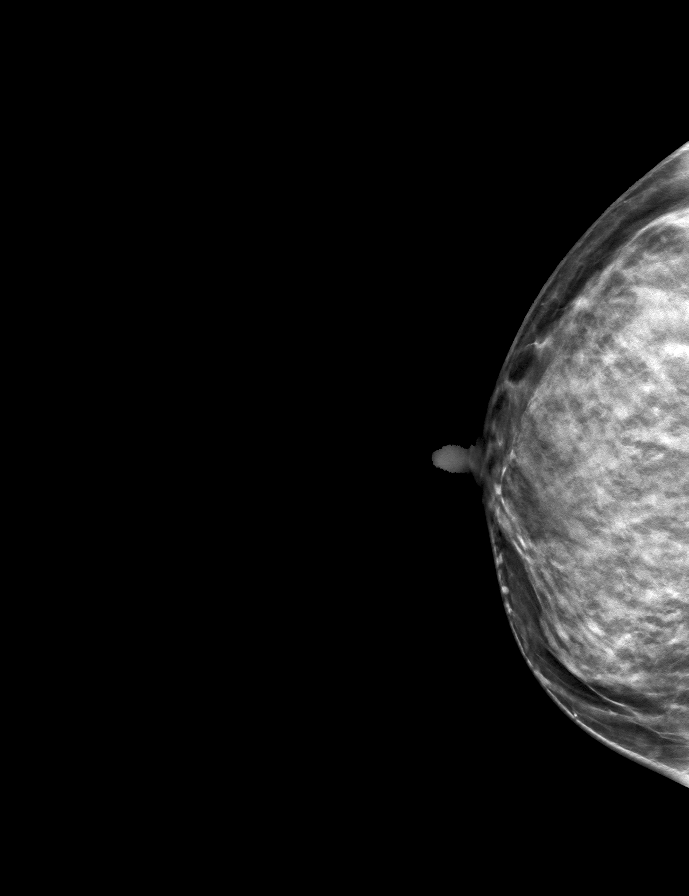

[9 of 24 positions shown; findings below may reference images not displayed]

ACR Breast Density Category d: The breast tissue is extremely dense,
which lowers the sensitivity of mammography
FINDINGS: There are no findings suspicious for malignancy.
IMPRESSION: No mammographic evidence of malignancy. A result letter of this
screening mammogram will be mailed directly to the patient.

RECOMMENDATION:
Screening mammogram in one year. (Code:[id number]-[id number])

BI-RADS CATEGORY  1: Negative.

## 2023-07-01 ENCOUNTER — Other Ambulatory Visit: Payer: Self-pay | Admitting: Internal Medicine

## 2023-07-01 ENCOUNTER — Encounter: Payer: Self-pay | Admitting: Internal Medicine

## 2023-07-02 ENCOUNTER — Other Ambulatory Visit: Payer: Self-pay

## 2023-07-02 ENCOUNTER — Other Ambulatory Visit (HOSPITAL_COMMUNITY): Payer: Self-pay

## 2023-07-02 MED ORDER — ZOLPIDEM TARTRATE 10 MG PO TABS
10.0000 mg | ORAL_TABLET | Freq: Every evening | ORAL | 0 refills | Status: DC | PRN
  Filled 2023-07-02: qty 30, 30d supply, fill #0

## 2023-07-02 MED ORDER — FREESTYLE LIBRE 3 SENSOR MISC
6 refills | Status: AC
  Filled 2023-07-02: qty 2, 28d supply, fill #0
  Filled 2024-02-26: qty 2, 28d supply, fill #1
  Filled 2024-02-26: qty 1, 7d supply, fill #1
  Filled 2024-02-26: qty 2, 28d supply, fill #1
  Filled 2024-02-26: qty 1, 21d supply, fill #1
  Filled 2024-04-22: qty 2, 28d supply, fill #2

## 2023-07-02 MED ORDER — FREESTYLE LIBRE 3 READER DEVI
0 refills | Status: AC
  Filled 2023-07-02: qty 1, 30d supply, fill #0

## 2023-07-05 ENCOUNTER — Other Ambulatory Visit (HOSPITAL_COMMUNITY): Payer: Self-pay

## 2023-09-27 DIAGNOSIS — F419 Anxiety disorder, unspecified: Secondary | ICD-10-CM | POA: Diagnosis not present

## 2023-09-27 DIAGNOSIS — F410 Panic disorder [episodic paroxysmal anxiety] without agoraphobia: Secondary | ICD-10-CM | POA: Diagnosis not present

## 2023-09-27 DIAGNOSIS — R45851 Suicidal ideations: Secondary | ICD-10-CM | POA: Diagnosis not present

## 2023-09-27 DIAGNOSIS — Z008 Encounter for other general examination: Secondary | ICD-10-CM | POA: Diagnosis not present

## 2023-10-07 ENCOUNTER — Other Ambulatory Visit: Payer: Self-pay | Admitting: Internal Medicine

## 2023-10-11 ENCOUNTER — Other Ambulatory Visit: Payer: Self-pay

## 2023-10-12 ENCOUNTER — Other Ambulatory Visit (HOSPITAL_COMMUNITY): Payer: Self-pay

## 2023-10-12 MED ORDER — ZOLPIDEM TARTRATE 10 MG PO TABS
10.0000 mg | ORAL_TABLET | Freq: Every evening | ORAL | 0 refills | Status: DC | PRN
  Filled 2023-10-12: qty 30, 30d supply, fill #0

## 2023-10-15 ENCOUNTER — Other Ambulatory Visit: Payer: Self-pay

## 2023-11-30 ENCOUNTER — Other Ambulatory Visit: Payer: Self-pay | Admitting: Internal Medicine

## 2023-11-30 DIAGNOSIS — Z1231 Encounter for screening mammogram for malignant neoplasm of breast: Secondary | ICD-10-CM

## 2023-12-05 ENCOUNTER — Encounter: Payer: Self-pay | Admitting: Internal Medicine

## 2024-01-10 ENCOUNTER — Other Ambulatory Visit (HOSPITAL_BASED_OUTPATIENT_CLINIC_OR_DEPARTMENT_OTHER): Payer: Self-pay

## 2024-01-11 ENCOUNTER — Ambulatory Visit
Admission: RE | Admit: 2024-01-11 | Discharge: 2024-01-11 | Disposition: A | Discharge status: Home / Self Care | Source: Ambulatory Visit | Attending: Internal Medicine | Admitting: Internal Medicine

## 2024-01-11 ENCOUNTER — Encounter: Payer: Self-pay | Admitting: Internal Medicine

## 2024-01-11 ENCOUNTER — Other Ambulatory Visit (HOSPITAL_COMMUNITY): Payer: Self-pay

## 2024-01-11 ENCOUNTER — Other Ambulatory Visit: Payer: Self-pay

## 2024-01-11 ENCOUNTER — Ambulatory Visit: Payer: 59 | Admitting: Internal Medicine

## 2024-01-11 VITALS — BP 100/70 | HR 71 | Temp 98.2°F | Ht 64.0 in | Wt 132.2 lb

## 2024-01-11 DIAGNOSIS — G8929 Other chronic pain: Secondary | ICD-10-CM | POA: Diagnosis not present

## 2024-01-11 DIAGNOSIS — Z0000 Encounter for general adult medical examination without abnormal findings: Secondary | ICD-10-CM

## 2024-01-11 DIAGNOSIS — M545 Low back pain, unspecified: Secondary | ICD-10-CM | POA: Diagnosis not present

## 2024-01-11 DIAGNOSIS — F5101 Primary insomnia: Secondary | ICD-10-CM

## 2024-01-11 DIAGNOSIS — Z1231 Encounter for screening mammogram for malignant neoplasm of breast: Secondary | ICD-10-CM | POA: Diagnosis not present

## 2024-01-11 DIAGNOSIS — Z23 Encounter for immunization: Secondary | ICD-10-CM | POA: Diagnosis not present

## 2024-01-11 DIAGNOSIS — R7302 Impaired glucose tolerance (oral): Secondary | ICD-10-CM

## 2024-01-11 LAB — COMPREHENSIVE METABOLIC PANEL WITH GFR
ALT: 22 U/L (ref 0–35)
AST: 23 U/L (ref 0–37)
Albumin: 4.3 g/dL (ref 3.5–5.2)
Alkaline Phosphatase: 46 U/L (ref 39–117)
BUN: 24 mg/dL — ABNORMAL HIGH (ref 6–23)
CO2: 31 meq/L (ref 19–32)
Calcium: 9.6 mg/dL (ref 8.4–10.5)
Chloride: 101 meq/L (ref 96–112)
Creatinine, Ser: 0.69 mg/dL (ref 0.40–1.20)
GFR: 98.34 mL/min (ref >60.00)
Glucose, Bld: 105 mg/dL — ABNORMAL HIGH (ref 70–99)
Potassium: 4.1 meq/L (ref 3.5–5.1)
Sodium: 140 meq/L (ref 135–145)
Total Bilirubin: 0.4 mg/dL (ref 0.2–1.2)
Total Protein: 7.3 g/dL (ref 6.0–8.3)

## 2024-01-11 LAB — CBC
HCT: 44.4 % (ref 36.0–46.0)
Hemoglobin: 14.7 g/dL (ref 12.0–15.0)
MCHC: 33.1 g/dL (ref 30.0–36.0)
MCV: 95.8 fl (ref 78.0–100.0)
Platelets: 220 K/uL (ref 150.0–400.0)
RBC: 4.63 Mil/uL (ref 3.87–5.11)
RDW: 12.1 % (ref 11.5–15.5)
WBC: 4.1 K/uL (ref 4.0–10.5)

## 2024-01-11 LAB — HEMOGLOBIN A1C: Hgb A1c MFr Bld: 6.5 % (ref 4.6–6.5)

## 2024-01-11 LAB — LIPID PANEL
Cholesterol: 186 mg/dL (ref 0–200)
HDL: 57.1 mg/dL (ref >39.00)
LDL Cholesterol: 121 mg/dL — ABNORMAL HIGH (ref 0–99)
NonHDL: 129.3
Total CHOL/HDL Ratio: 3
Triglycerides: 42 mg/dL (ref 0.0–149.0)
VLDL: 8.4 mg/dL (ref 0.0–40.0)

## 2024-01-11 LAB — VITAMIN D 25 HYDROXY (VIT D DEFICIENCY, FRACTURES): VITD: 26.69 ng/mL — ABNORMAL LOW (ref 30.00–100.00)

## 2024-01-11 LAB — TSH: TSH: 1.27 u[IU]/mL (ref 0.35–5.50)

## 2024-01-11 MED ORDER — ZOLPIDEM TARTRATE 10 MG PO TABS
10.0000 mg | ORAL_TABLET | Freq: Every evening | ORAL | 0 refills | Status: AC | PRN
  Filled 2024-01-11: qty 30, 30d supply, fill #0

## 2024-01-11 NOTE — Assessment & Plan Note (Signed)
 Refilled ambien  which she uses rarely. PDMP reviewed and appropriate.

## 2024-01-11 NOTE — Assessment & Plan Note (Signed)
 Checking HgA1c and adjust as needed.

## 2024-01-11 NOTE — Assessment & Plan Note (Signed)
 Flu shot yearly. Pneumonia given 20. Shingrix complete. Tetanus given. Colonoscopy up to date. Mammogram up to date, pap smear up to date. Counseled about sun safety and mole surveillance. Counseled about the dangers of distracted driving. Given 10 year screening recommendations.

## 2024-01-11 NOTE — Progress Notes (Signed)
   Subjective:   Patient ID: Shirley Tucker, female    DOB: 17-Jan-1971, 53 y.o.   MRN: [id number]  The patient is here for physical. Pertinent topics discussed: Discussed the use of AI scribe software for clinical note transcription with the patient, who gave verbal consent to proceed.  History of Present Illness Shirley Tucker is a 53 year old female with prediabetes and postmenopausal symptoms who presents for evaluation of her A1c levels and management of menopausal symptoms.  She is concerned about her blood sugar levels, noting that her postprandial glucose frequently surges above 200 mg/dL. She has been monitoring her glucose using the [employer] 3 system. Despite dietary changes over the past six months, including reducing carbohydrate intake and increasing protein, she has not observed significant weight loss. Her weight has fluctuated, peaking at 135-136 pounds and currently around 132-133 pounds. She previously tried metformin , including a twice-daily regimen, but did not notice any change in her A1c levels, which remained stable after three months of use. Consequently, she discontinued the medication.  PMH, [facility], social history reviewed and updated  Review of Systems  Constitutional: Negative.   HENT: Negative.    Eyes: Negative.   Respiratory:  Negative for cough, chest tightness and shortness of breath.   Cardiovascular:  Negative for chest pain, palpitations and leg swelling.  Gastrointestinal:  Negative for abdominal distention, abdominal pain, constipation, diarrhea, nausea and vomiting.  Musculoskeletal: Negative.   Skin: Negative.   Neurological: Negative.   Psychiatric/Behavioral: Negative.      Objective:  Physical Exam Constitutional:      Appearance: She is well-developed.  HENT:     Head: Normocephalic and atraumatic.  Cardiovascular:     Rate and Rhythm: Normal rate and regular rhythm.  Pulmonary:     Effort: Pulmonary effort is normal. No respiratory distress.     Breath  sounds: Normal breath sounds. No wheezing or rales.  Abdominal:     General: Bowel sounds are normal. There is no distension.     Palpations: Abdomen is soft.     Tenderness: There is no abdominal tenderness. There is no rebound.  Musculoskeletal:     Cervical back: Normal range of motion.  Skin:    General: Skin is warm and dry.  Neurological:     Mental Status: She is alert and oriented to person, place, and time.     Coordination: Coordination normal.     Vitals:   01/11/24 0857  BP: 100/70  Pulse: 71  Temp: 98.2 F (36.8 C)  TempSrc: Oral  SpO2: 95%  Weight: 132 lb 3.2 oz (60 kg)  Height: 5' 4 (1.626 m)    Assessment & Plan:  Tdap and prevnar 20 given at visit

## 2024-01-11 NOTE — Assessment & Plan Note (Signed)
Managed with exercise.

## 2024-01-15 ENCOUNTER — Ambulatory Visit: Payer: Self-pay | Admitting: Internal Medicine

## 2024-01-15 DIAGNOSIS — R7302 Impaired glucose tolerance (oral): Secondary | ICD-10-CM

## 2024-01-17 ENCOUNTER — Ambulatory Visit: Payer: Self-pay | Admitting: Internal Medicine

## 2024-01-17 LAB — HM MAMMOGRAPHY

## 2024-01-18 ENCOUNTER — Other Ambulatory Visit

## 2024-02-26 ENCOUNTER — Other Ambulatory Visit (HOSPITAL_COMMUNITY): Payer: Self-pay

## 2024-02-28 ENCOUNTER — Other Ambulatory Visit (HOSPITAL_COMMUNITY): Payer: Self-pay

## 2024-03-07 ENCOUNTER — Other Ambulatory Visit

## 2024-03-14 ENCOUNTER — Other Ambulatory Visit

## 2024-03-21 ENCOUNTER — Ambulatory Visit
Admission: RE | Admit: 2024-03-21 | Discharge: 2024-03-21 | Disposition: A | Discharge status: Home / Self Care | Payer: Self-pay | Source: Ambulatory Visit | Attending: Internal Medicine | Admitting: Internal Medicine

## 2024-03-21 DIAGNOSIS — Z0000 Encounter for general adult medical examination without abnormal findings: Secondary | ICD-10-CM

## 2024-04-22 ENCOUNTER — Other Ambulatory Visit (HOSPITAL_COMMUNITY): Payer: Self-pay

## 2024-04-23 ENCOUNTER — Other Ambulatory Visit (HOSPITAL_COMMUNITY): Payer: Self-pay

## 2024-04-24 ENCOUNTER — Other Ambulatory Visit (HOSPITAL_COMMUNITY): Payer: Self-pay

## 2024-05-30 ENCOUNTER — Encounter: Payer: Self-pay | Admitting: Internal Medicine

## 2024-06-20 ENCOUNTER — Encounter: Payer: Self-pay | Admitting: Internal Medicine

## 2024-06-20 ENCOUNTER — Other Ambulatory Visit

## 2024-06-20 DIAGNOSIS — R7302 Impaired glucose tolerance (oral): Secondary | ICD-10-CM

## 2024-06-20 LAB — HEMOGLOBIN A1C: Hgb A1c MFr Bld: 6.2 % (ref 4.6–6.5)

## 2025-01-16 ENCOUNTER — Encounter: Admitting: Internal Medicine

## 2025-01-23 ENCOUNTER — Encounter: Admitting: Internal Medicine
